# Patient Record
Sex: Male | Born: 1935 | Race: Black or African American | Hispanic: No | Marital: Married | Smoking: Former smoker
Health system: Southern US, Community
[De-identification: ages and names within clinical notes are randomized; demographics above are authoritative.]

## PROBLEM LIST (undated history)

## (undated) DIAGNOSIS — E782 Mixed hyperlipidemia: Secondary | ICD-10-CM

## (undated) DIAGNOSIS — G4733 Obstructive sleep apnea (adult) (pediatric): Secondary | ICD-10-CM

## (undated) DIAGNOSIS — I6529 Occlusion and stenosis of unspecified carotid artery: Secondary | ICD-10-CM

## (undated) DIAGNOSIS — I429 Cardiomyopathy, unspecified: Secondary | ICD-10-CM

## (undated) DIAGNOSIS — I671 Cerebral aneurysm, nonruptured: Secondary | ICD-10-CM

## (undated) DIAGNOSIS — J449 Chronic obstructive pulmonary disease, unspecified: Secondary | ICD-10-CM

## (undated) DIAGNOSIS — I4892 Unspecified atrial flutter: Secondary | ICD-10-CM

## (undated) DIAGNOSIS — Z8611 Personal history of tuberculosis: Secondary | ICD-10-CM

## (undated) DIAGNOSIS — I2510 Atherosclerotic heart disease of native coronary artery without angina pectoris: Secondary | ICD-10-CM

## (undated) DIAGNOSIS — R911 Solitary pulmonary nodule: Secondary | ICD-10-CM

## (undated) DIAGNOSIS — E119 Type 2 diabetes mellitus without complications: Secondary | ICD-10-CM

## (undated) DIAGNOSIS — I10 Essential (primary) hypertension: Secondary | ICD-10-CM

## (undated) HISTORY — DX: Cerebral aneurysm, nonruptured: I67.1

## (undated) HISTORY — DX: Unspecified atrial flutter: I48.92

## (undated) HISTORY — DX: Mixed hyperlipidemia: E78.2

## (undated) HISTORY — DX: Obstructive sleep apnea (adult) (pediatric): G47.33

## (undated) HISTORY — DX: Chronic obstructive pulmonary disease, unspecified: J44.9

## (undated) HISTORY — DX: Cardiomyopathy, unspecified: I42.9

## (undated) HISTORY — DX: Atherosclerotic heart disease of native coronary artery without angina pectoris: I25.10

## (undated) HISTORY — DX: Type 2 diabetes mellitus without complications: E11.9

## (undated) HISTORY — DX: Occlusion and stenosis of unspecified carotid artery: I65.29

## (undated) HISTORY — DX: Solitary pulmonary nodule: R91.1

## (undated) HISTORY — DX: Personal history of tuberculosis: Z86.11

## (undated) HISTORY — DX: Essential (primary) hypertension: I10

---

## 2003-04-08 HISTORY — PX: CORONARY ARTERY BYPASS GRAFT: SHX141

## 2003-06-05 ENCOUNTER — Ambulatory Visit (HOSPITAL_COMMUNITY): Admission: RE | Admit: 2003-06-05 | Discharge: 2003-06-05 | Payer: Self-pay | Admitting: Family Medicine

## 2003-06-09 ENCOUNTER — Ambulatory Visit (HOSPITAL_COMMUNITY): Admission: RE | Admit: 2003-06-09 | Discharge: 2003-06-09 | Payer: Self-pay | Admitting: Family Medicine

## 2003-07-28 ENCOUNTER — Ambulatory Visit (HOSPITAL_COMMUNITY): Admission: RE | Admit: 2003-07-28 | Discharge: 2003-07-28 | Payer: Self-pay | Admitting: Internal Medicine

## 2003-11-12 ENCOUNTER — Emergency Department (HOSPITAL_COMMUNITY): Admission: EM | Admit: 2003-11-12 | Discharge: 2003-11-12 | Payer: Self-pay | Admitting: Emergency Medicine

## 2003-11-14 ENCOUNTER — Ambulatory Visit (HOSPITAL_COMMUNITY): Admission: RE | Admit: 2003-11-14 | Discharge: 2003-11-14 | Payer: Self-pay | Admitting: Family Medicine

## 2003-11-15 ENCOUNTER — Emergency Department (HOSPITAL_COMMUNITY): Admission: EM | Admit: 2003-11-15 | Discharge: 2003-11-16 | Payer: Self-pay | Admitting: Emergency Medicine

## 2003-11-16 ENCOUNTER — Inpatient Hospital Stay (HOSPITAL_COMMUNITY): Admission: AD | Admit: 2003-11-16 | Discharge: 2003-11-22 | Payer: Self-pay | Admitting: Cardiology

## 2003-11-21 ENCOUNTER — Encounter: Payer: Self-pay | Admitting: Cardiology

## 2004-05-27 ENCOUNTER — Ambulatory Visit: Payer: Self-pay | Admitting: *Deleted

## 2004-08-26 ENCOUNTER — Ambulatory Visit: Payer: Self-pay | Admitting: Physician Assistant

## 2004-11-15 ENCOUNTER — Ambulatory Visit: Payer: Self-pay | Admitting: Cardiology

## 2005-01-02 ENCOUNTER — Ambulatory Visit (HOSPITAL_COMMUNITY): Admission: RE | Admit: 2005-01-02 | Discharge: 2005-01-02 | Payer: Self-pay | Admitting: Family Medicine

## 2005-01-27 ENCOUNTER — Ambulatory Visit: Payer: Self-pay | Admitting: *Deleted

## 2005-05-14 ENCOUNTER — Ambulatory Visit: Payer: Self-pay | Admitting: *Deleted

## 2005-07-21 ENCOUNTER — Ambulatory Visit (HOSPITAL_COMMUNITY): Admission: RE | Admit: 2005-07-21 | Discharge: 2005-07-21 | Payer: Self-pay | Admitting: Family Medicine

## 2005-07-28 ENCOUNTER — Ambulatory Visit: Payer: Self-pay | Admitting: *Deleted

## 2006-05-15 ENCOUNTER — Ambulatory Visit: Payer: Self-pay | Admitting: Cardiovascular Disease

## 2006-05-27 ENCOUNTER — Ambulatory Visit: Payer: Self-pay | Admitting: Cardiovascular Disease

## 2006-05-27 ENCOUNTER — Encounter (HOSPITAL_COMMUNITY): Admission: RE | Admit: 2006-05-27 | Discharge: 2006-06-26 | Payer: Self-pay | Admitting: Cardiovascular Disease

## 2007-01-23 ENCOUNTER — Emergency Department (HOSPITAL_COMMUNITY): Admission: EM | Admit: 2007-01-23 | Discharge: 2007-01-23 | Payer: Self-pay | Admitting: Emergency Medicine

## 2007-01-28 ENCOUNTER — Ambulatory Visit (HOSPITAL_COMMUNITY): Admission: RE | Admit: 2007-01-28 | Discharge: 2007-01-28 | Payer: Self-pay | Admitting: Family Medicine

## 2007-02-04 ENCOUNTER — Ambulatory Visit: Payer: Self-pay | Admitting: Cardiology

## 2007-02-05 ENCOUNTER — Ambulatory Visit: Payer: Self-pay | Admitting: Internal Medicine

## 2007-02-05 ENCOUNTER — Ambulatory Visit (HOSPITAL_COMMUNITY): Admission: RE | Admit: 2007-02-05 | Discharge: 2007-02-05 | Payer: Self-pay | Admitting: Cardiology

## 2007-04-05 ENCOUNTER — Ambulatory Visit: Payer: Self-pay | Admitting: Cardiovascular Disease

## 2007-06-07 ENCOUNTER — Encounter (HOSPITAL_COMMUNITY): Admission: RE | Admit: 2007-06-07 | Discharge: 2007-07-07 | Payer: Self-pay | Admitting: Cardiovascular Disease

## 2007-06-14 ENCOUNTER — Ambulatory Visit: Payer: Self-pay | Admitting: Internal Medicine

## 2007-06-21 ENCOUNTER — Ambulatory Visit (HOSPITAL_COMMUNITY): Admission: RE | Admit: 2007-06-21 | Discharge: 2007-06-21 | Payer: Self-pay | Admitting: Cardiology

## 2007-06-25 ENCOUNTER — Ambulatory Visit: Payer: Self-pay | Admitting: Cardiology

## 2007-06-25 ENCOUNTER — Encounter: Payer: Self-pay | Admitting: Cardiology

## 2007-06-25 ENCOUNTER — Inpatient Hospital Stay (HOSPITAL_COMMUNITY): Admission: AD | Admit: 2007-06-25 | Discharge: 2007-07-07 | Payer: Self-pay | Admitting: Cardiology

## 2007-06-25 ENCOUNTER — Inpatient Hospital Stay (HOSPITAL_BASED_OUTPATIENT_CLINIC_OR_DEPARTMENT_OTHER): Admission: RE | Admit: 2007-06-25 | Discharge: 2007-06-25 | Payer: Self-pay | Admitting: Cardiology

## 2007-06-25 ENCOUNTER — Encounter: Payer: Self-pay | Admitting: Cardiothoracic Surgery

## 2007-06-28 ENCOUNTER — Ambulatory Visit: Payer: Self-pay | Admitting: Surgery

## 2007-07-30 ENCOUNTER — Ambulatory Visit: Payer: Self-pay | Admitting: Cardiothoracic Surgery

## 2007-07-30 ENCOUNTER — Encounter: Admission: RE | Admit: 2007-07-30 | Discharge: 2007-07-30 | Payer: Self-pay | Admitting: Cardiothoracic Surgery

## 2007-08-05 ENCOUNTER — Encounter (HOSPITAL_COMMUNITY): Admission: RE | Admit: 2007-08-05 | Discharge: 2007-09-04 | Payer: Self-pay | Admitting: Cardiovascular Disease

## 2007-08-11 ENCOUNTER — Ambulatory Visit: Payer: Self-pay | Admitting: Cardiovascular Disease

## 2008-10-18 IMAGING — CR DG CHEST 1V PORT
1 series · 1 of 1 positions shown · non-contrast
Comparison: [DATE] x 9

CLINICAL DATA: CAD, CABG

PORTABLE CHEST - 1 VIEW

[view not recorded]
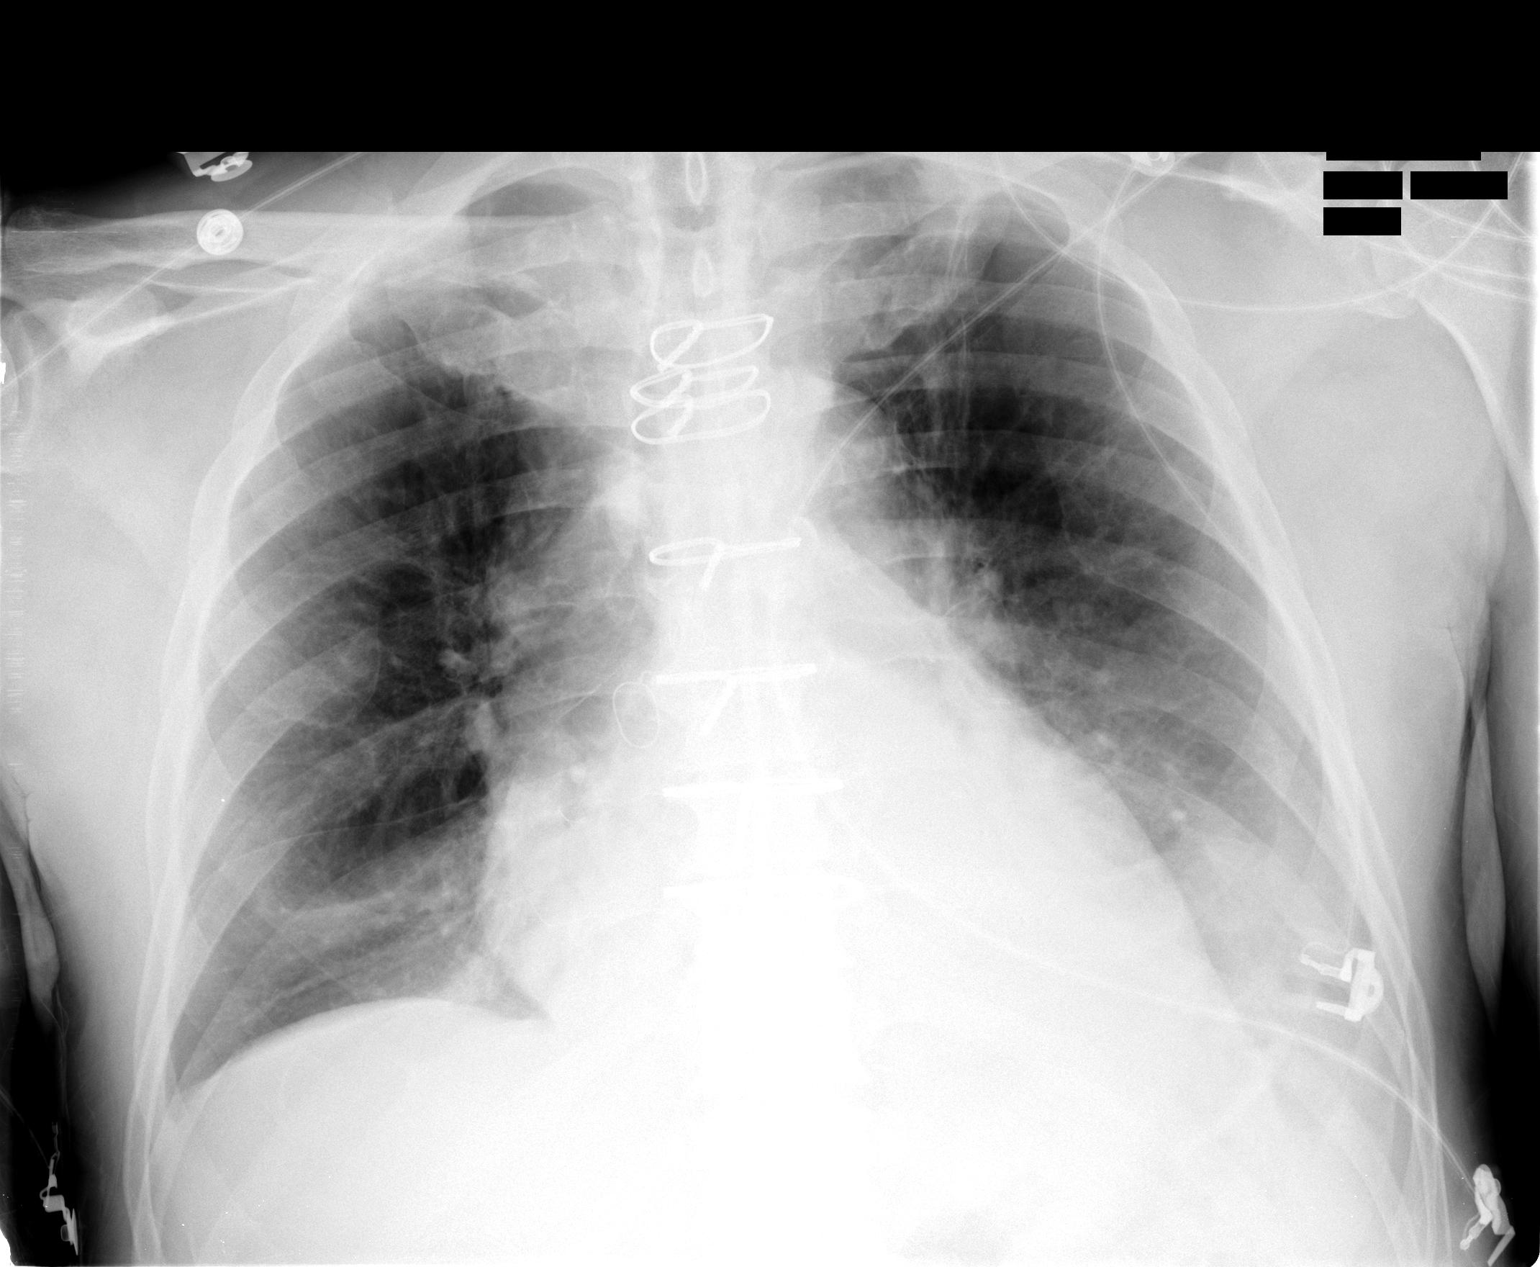

[1 of 1 positions shown; findings below may reference images not displayed]

FINDINGS: Portable exam at 2522 hours shows the patient to have had
a median sternotomy and CABG.  Right IJ sheath has been removed.
Heart is enlarged.  There is pulmonary vascular congestion.
Improved aeration is noted at the right lung base.  Persistent left
lower lobe density is identified.  Left pleural effusion is
suspected.
IMPRESSION: Improved aeration right lung base.

## 2009-04-07 ENCOUNTER — Emergency Department (HOSPITAL_COMMUNITY): Admission: EM | Admit: 2009-04-07 | Discharge: 2009-04-08 | Payer: Self-pay | Admitting: Emergency Medicine

## 2009-07-09 ENCOUNTER — Encounter (INDEPENDENT_AMBULATORY_CARE_PROVIDER_SITE_OTHER): Payer: Self-pay | Admitting: *Deleted

## 2009-07-09 LAB — CONVERTED CEMR LAB
ALT: 18 units/L
Albumin: 4.5 g/dL
Bilirubin, Direct: 0.2 mg/dL
CO2: 20 meq/L
Calcium: 9.7 mg/dL
Chloride: 103 meq/L
Cholesterol: 115 mg/dL
Glucose, Bld: 102 mg/dL
Hgb A1c MFr Bld: 8.4 %
LDL Cholesterol: 53 mg/dL
Sodium: 140 meq/L
Total Protein: 7.1 g/dL

## 2009-07-31 ENCOUNTER — Encounter (INDEPENDENT_AMBULATORY_CARE_PROVIDER_SITE_OTHER): Payer: Self-pay | Admitting: *Deleted

## 2009-07-31 ENCOUNTER — Ambulatory Visit: Payer: Self-pay | Admitting: Cardiology

## 2009-07-31 DIAGNOSIS — I2510 Atherosclerotic heart disease of native coronary artery without angina pectoris: Secondary | ICD-10-CM

## 2009-07-31 DIAGNOSIS — E785 Hyperlipidemia, unspecified: Secondary | ICD-10-CM

## 2009-07-31 DIAGNOSIS — I255 Ischemic cardiomyopathy: Secondary | ICD-10-CM

## 2009-07-31 DIAGNOSIS — E119 Type 2 diabetes mellitus without complications: Secondary | ICD-10-CM

## 2009-07-31 DIAGNOSIS — I6529 Occlusion and stenosis of unspecified carotid artery: Secondary | ICD-10-CM | POA: Insufficient documentation

## 2009-07-31 DIAGNOSIS — I10 Essential (primary) hypertension: Secondary | ICD-10-CM

## 2009-07-31 DIAGNOSIS — R0602 Shortness of breath: Secondary | ICD-10-CM

## 2009-08-01 ENCOUNTER — Encounter: Payer: Self-pay | Admitting: Cardiology

## 2009-08-06 ENCOUNTER — Ambulatory Visit (HOSPITAL_COMMUNITY): Admission: RE | Admit: 2009-08-06 | Discharge: 2009-08-06 | Payer: Self-pay | Admitting: Cardiology

## 2009-08-06 ENCOUNTER — Encounter: Payer: Self-pay | Admitting: Cardiology

## 2009-08-07 ENCOUNTER — Encounter: Payer: Self-pay | Admitting: Cardiology

## 2009-08-07 ENCOUNTER — Ambulatory Visit (HOSPITAL_COMMUNITY): Admission: RE | Admit: 2009-08-07 | Discharge: 2009-08-07 | Payer: Self-pay | Admitting: Cardiology

## 2009-08-07 ENCOUNTER — Ambulatory Visit: Payer: Self-pay | Admitting: Cardiology

## 2009-08-08 ENCOUNTER — Encounter: Payer: Self-pay | Admitting: Cardiology

## 2010-01-14 ENCOUNTER — Ambulatory Visit: Payer: Self-pay | Admitting: Cardiology

## 2010-01-14 ENCOUNTER — Encounter: Payer: Self-pay | Admitting: Adult Health

## 2010-04-28 ENCOUNTER — Encounter: Payer: Self-pay | Admitting: Cardiothoracic Surgery

## 2010-05-07 NOTE — Letter (Signed)
Summary: [place] Results Engineer, agricultural at [facility]  [address] [place] [address]   Phone: [phone]  Fax: [phone]      Aug 08, 2009 MRN: [id number]   HANS RUSHER [address] [place], [place]  [address]   Dear Mr. Racey,  Your test ordered by Selena Batten has been reviewed by your physician (or physician assistant) and was found to be normal or stable. Your physician (or physician assistant) felt no changes were needed at this time.  __X__ Echocardiogram  ____ Cardiac Stress Test  ____ Lab Work  ____ Peripheral vascular study of arms, legs or neck  ____ CT scan or X-ray  ____ Lung or Breathing test  ____ Other: Please continue on current medical treatment.  Thank you.   Nona Dell, MD, F.A.C.C

## 2010-05-07 NOTE — Letter (Signed)
Summary: [place] Results Engineer, agricultural at [facility]  [address] [place] [address]   Phone: [phone]  Fax: [phone]      Aug 07, 2009 MRN: [id number]   Jared Santos [address] [place], [place]  [address]   Dear Mr. Fesperman,  Your test ordered by Selena Batten has been reviewed by your physician (or physician assistant) and was found to be normal or stable. Your physician (or physician assistant) felt no changes were needed at this time.  ____ Echocardiogram  ____ Cardiac Stress Test  ____ Lab Work  __X__ Peripheral vascular study of arms, legs or neck  ____ CT scan or X-ray  ____ Lung or Breathing test  ____ Other: Please continue on current medical treatment.  Thank you.   Nona Dell, MD, F.A.C.C

## 2010-05-07 NOTE — Miscellaneous (Signed)
Summary: Dr.Gerald Hill labs bmp,lipids,liver,a1c4/07/2009  Clinical Lists Changes  Observations: Added new observation of CALCIUM: 9.7 mg/dL (16/01/9603 5:40) Added new observation of ALBUMIN: 4.5 g/dL (98/02/9146 8:29) Added new observation of PROTEIN, TOT: 7.1 g/dL (56/21/3086 5:78) Added new observation of SGPT (ALT): 18 units/L (07/09/2009 8:55) Added new observation of SGOT (AST): 20 units/L (07/09/2009 8:55) Added new observation of ALK PHOS: 74 units/L (07/09/2009 8:55) Added new observation of BILI DIRECT: 0.2 mg/dL (46/96/2952 8:41) Added new observation of CREATININE: 1.25 mg/dL (32/44/0102 7:25) Added new observation of BUN: 25 mg/dL (36/64/4034 7:42) Added new observation of BG RANDOM: 102 mg/dL (59/56/3875 6:43) Added new observation of CO2 PLSM/SER: 20 meq/L (07/09/2009 8:55) Added new observation of CL SERUM: 103 meq/L (07/09/2009 8:55) Added new observation of K SERUM: 4.3 meq/L (07/09/2009 8:55) Added new observation of NA: 140 meq/L (07/09/2009 8:55) Added new observation of LDL: 53 mg/dL (32/95/1884 1:66) Added new observation of HDL: 40 mg/dL (10/05/1599 0:93) Added new observation of TRIGLYC TOT: 111 mg/dL (23/55/7322 0:25) Added new observation of CHOLESTEROL: 115 mg/dL (42/70/6237 6:28) Added new observation of HGBA1C: 8.4 % (07/09/2009 8:55)

## 2010-05-07 NOTE — Assessment & Plan Note (Signed)
Summary: per Dr.Gerald Hill for HX of CAD/tg   Visit Type:  Follow-up Primary Provider:  Dr. Mirna Mires   History of Present Illness: 76 year old male referred for a followup visit. This is my first meeting with him. He was previously followed in this office by Dr. Dietrich Pates, and most recently saw Dr. Eden Emms in 2009. I reviewed his medical history which is detailed below.  Jared Santos denies any significant angina. He has not used any nitroglycerin pills. He reports compliance with medications. Recent lab work ordered by Dr. Loleta Chance back on 5 April revealed sodium 140, potassium 4.3, BUN 25, creatinine 1.2, total cholesterol 115, triglycerides 111, HDL 40, LDL 53, AST 20, ALT 18.  Jared Santos underwent a Myoview back in March of 2009, detailed below, and abnormal suggesting scar versus attenuation affecting a fairly large region of the anterior, anteroseptal, apical, and inferior walls. LVEF was calculated at 31% with diffuse hypokinesis and apical akinesis. I am not certain about the followup arrangements for this study, however the patient has certainly not been seen here since that time.  He reports occasional right lower extremity edema status post vein harvesting. No palpitations, orthopnea, or syncope. No reported claudication. He denies having followup for carotid artery disease at least in the last few years.   Current Medications (verified): 1)  Metformin Hcl 1000 Mg Tabs (Metformin Hcl) .... Take 1 Tab Two Times A Day 2)  Glipizide 10 Mg Tabs (Glipizide) .... Take 1 Tab Two Times A Day 3)  Lisinopril-Hydrochlorothiazide 20-12.5 Mg Tabs (Lisinopril-Hydrochlorothiazide) .... Take 1 Tab Daily 4)  Simvastatin 20 Mg Tabs (Simvastatin) .... Take 1 Tab Daily 5)  Tylenol 325 Mg Tabs (Acetaminophen) .... Take As Needed 6)  Aspir-Low 81 Mg Tbec (Aspirin) .... Take 1 Tab Daily 7)  Nitrostat 0.4 Mg Subl (Nitroglycerin) .Marland Kitchen.. 1 Tablet Under Tongue At Onset of Chest Pain; You May Repeat Every 5 Minutes  For Up To 3 Doses. 8)  Coreg 3.125 Mg Tabs (Carvedilol) .... Take 1 Tablet By Mouth Two Times A Day  Allergies (verified): No Known Drug Allergies  Past History:  Family History: Last updated: Aug 19, 2009 Father: died late 35s of unknown cause Mother: died age 19 with diabetes Siblings: cancer, hypertension, CAD, diabetes  Social History: Last updated: 19-Aug-2009 Retired  Married  Tobacco Use - No  Alcohol Use - no Regular Exercise - no  Past Medical History: Atrial Flutter - s/p RFA 2005 CAD - multivessel, DES RCA 2005, subsequent CABG, LVEF 50% Diabetes Type 2 Hyperlipidemia Hypertension PVD - occluded RICA History of tuberculosis Myocardial Infarction - 2005 Cerebral aneurysm with hemorrhage status post clamping procedure C O P D  Past Surgical History: CABG 2005 - LIMA to LAD, SVG to diagonal, SVG to OM, SVG to RCA and PDA  Family History: Father: died late 88s of unknown cause Mother: died age 44 with diabetes Siblings: cancer, hypertension, CAD, diabetes  Social History: Retired  Married  Tobacco Use - No  Alcohol Use - no Regular Exercise - no  Review of Systems  The patient denies anorexia, fever, weight loss, chest pain, syncope, prolonged cough, headaches, hemoptysis, abdominal pain, melena, hematochezia, and severe indigestion/heartburn.         Otherwise reviewed and negative except as outlined.  Vital Signs:  Patient profile:   76 year old male Height:      71 inches Weight:      196 pounds BMI:     27.44 Pulse rate:   75 / minute BP  sitting:   154 / 72  (right arm)  Vitals Entered By: Dreama Saa, CNA (July 31, 2009 1:42 PM)  Physical Exam  Additional Exam:  Overweight male in no acute distress. HEENT: Conjunctiva and lids normal, oropharynx with moist mucosa. Neck: Supple, very soft left carotid bruit. Lungs: Clear to auscultation, nonlabored. Cardiac: Regular rate and rhythm with frequent ectopy, no S3 is apparent. Abdomen:  Soft, nontender, bowel sounds present. No bruits. Skin: Warm and dry.  Extremities: Trace ankle edema right greater than left, distal pulses 1-2+. Musculoskeletal: No kyphosis. Neuropsychiatric: Alert and oriented x3, affect appropriate.   Nuclear Study  Procedure date:  06/14/2007  Findings:       NUCLEAR DATA:  The patient was studied in a 1 day protocol over a 2   day period though because of snow delay.  He was injected with 30.0   mCi Tc80m labeled Myoview at stress.  10.0 mCi Tc5m labeled Myoview   at rest.  Images are reconstructed in the short, vertical, and   horizontal axes.   On the initial stress images, there was a large defect involving the   anterior wall (mid distal, anteroseptal wall base mid distal) and   apex. There was also decreased tracer counts in the inferior wall   (base mid distal) inferoseptal wall (base mid).   In the recovery images, there was mild improvement in the   anteroseptal wall (base) inferoseptal wall (base mid), but otherwise   no significant change.   Note, in review of the raw data, there is shadowing and activity   under the inferior wall (diaphragm/bowel).  On gating, LVF was   calculated at 31% with diffuse hypokinesis, apical akinesis.   IMPRESSION:   Stress Myoview clinically negative though poor exercise tolerance.   Electrically negative for ischemia.  Myoview with an extensive defect   in the anterior anteroseptal apical inferior inferoseptal regions   with question some small amount of ischemia in the septal wall   (borderline significant).  Overall consistent with scar and possible   soft tissue attenuation.  Cannot exclude hibernation.  Note, in   review from report generated in 2008, this is significantly changed.  EKG  Procedure date:  07/31/2009  Findings:      Sinus rhythm with frequent premature atrial complexes, right bundle branch block, left posterior fascicular block, probable old inferior infarct pattern,  nonspecific T wave changes. Probable anteroseptal infarct pattern.  Impression & Recommendations:  Problem # 1:  CORONARY ATHEROSCLEROSIS, NATIVE VESSEL (ICD-414.01)  Reportedly symptomatically stable without active angina. Patient has a history of prior inferior wall myocardial infarction status post DES to RCA in 2005, with subsequent bypass grafting for development of multivessel disease in 2009. There is discrepancy in assessment of LVEF over time ranging from 50% down to 31% by followup Myoview in 2009. He also has evidence of an anteroseptal infarct at some point. He denies any decline in functional capacity or change in his baseline breathing pattern. Plan to continue medical therapy at this point. Refills provided for sublingual nitroglycerin.  His updated medication list for this problem includes:    Lisinopril-hydrochlorothiazide 20-12.5 Mg Tabs (Lisinopril-hydrochlorothiazide) .Marland Kitchen... Take 1 tab daily    Aspir-low 81 Mg Tbec (Aspirin) .Marland Kitchen... Take 1 tab daily    Nitrostat 0.4 Mg Subl (Nitroglycerin) .Marland Kitchen... 1 tablet under tongue at onset of chest pain; you may repeat every 5 minutes for up to 3 doses.    Coreg 3.125 Mg Tabs (Carvedilol) .Marland Kitchen... Take 1  tablet by mouth two times a day  Problem # 2:  UNSPECIFIED SECONDARY CARDIOMYOPATHY (ICD-425.9)  Possible LV dysfunction based on testing over the last few years. Plan to follow up with a 2-D echocardiogram, and also initiate low-dose Coreg 3.125 mg p.o. b.i.d. This may also help with his blood pressure somewhat.  His updated medication list for this problem includes:    Lisinopril-hydrochlorothiazide 20-12.5 Mg Tabs (Lisinopril-hydrochlorothiazide) .Marland Kitchen... Take 1 tab daily    Aspir-low 81 Mg Tbec (Aspirin) .Marland Kitchen... Take 1 tab daily    Nitrostat 0.4 Mg Subl (Nitroglycerin) .Marland Kitchen... 1 tablet under tongue at onset of chest pain; you may repeat every 5 minutes for up to 3 doses.    Coreg 3.125 Mg Tabs (Carvedilol) .Marland Kitchen... Take 1 tablet by mouth two times a  day  Problem # 3:  ESSENTIAL HYPERTENSION, BENIGN (ICD-401.1)  Blood pressure not optimally controlled today. We discussed this, and he will keep an eye on measurements. Coreg is also being added.  His updated medication list for this problem includes:    Lisinopril-hydrochlorothiazide 20-12.5 Mg Tabs (Lisinopril-hydrochlorothiazide) .Marland Kitchen... Take 1 tab daily    Aspir-low 81 Mg Tbec (Aspirin) .Marland Kitchen... Take 1 tab daily    Coreg 3.125 Mg Tabs (Carvedilol) .Marland Kitchen... Take 1 tablet by mouth two times a day  Problem # 4:  CAROTID ARTERY DISEASE (ICD-433.10)  Reported history of occluded RICA, with residual disease on the left. He has not had a recent followup carotid duplex. This will be arranged.  His updated medication list for this problem includes:    Aspir-low 81 Mg Tbec (Aspirin) .Marland Kitchen... Take 1 tab daily  Other Orders: 2-D Echocardiogram (2D Echo) Carotid Duplex (Carotid Duplex)  Patient Instructions: 1)  Your physician recommends that you schedule a follow-up appointment in: 6 months 2)  Your physician has requested that you have a carotid duplex. This test is an ultrasound of the carotid arteries in your neck. It looks at blood flow through these arteries that supply the brain with blood. Allow one hour for this exam. There are no restrictions or special instructions. 3)  Your physician has requested that you have an echocardiogram.  Echocardiography is a painless test that uses sound waves to create images of your heart. It provides your doctor with information about the size and shape of your heart and how well your heart's chambers and valves are working.  This procedure takes approximately one hour. There are no restrictions for this procedure. 4)  Your physician has recommended you make the following change in your medication: Start taking Carvedilol 3.125mg  by mouth two times a day and Nitroglycerin as needed for chest pain Prescriptions: COREG 3.125 MG TABS (CARVEDILOL) take 1 tablet by  mouth two times a day  #60 x 6   Entered by:   Larita Fife Via LPN   Authorized by:   Loreli Slot, MD, [facility]   Signed by:   Larita Fife Via LPN on 04/54/0981   Method used:   Electronically to        [facility] (retail)       [address]       [place], [place]  [address]       Ph: [phone]       Fax: [phone]   RxID:   [id number] NITROSTAT 0.4 MG SUBL (NITROGLYCERIN) 1 tablet under tongue at onset of chest pain; you may repeat every 5 minutes for up to 3 doses.  #  25 x 3   Entered by:   Larita Fife Via LPN   Authorized by:   Loreli Slot, MD, [facility]   Signed by:   Larita Fife Via LPN on 27/25/3664   Method used:   Electronically to        [facility] (retail)       [address]       [place], [place]  [address]       Ph: [phone]       Fax: [phone]   RxID:   [id number]

## 2010-05-07 NOTE — Assessment & Plan Note (Signed)
Summary: f2y    Visit Type:  Follow-up Primary Provider:  Dr. Mirna Mires   History of Present Illness: Mr. Hitchner is a 4 AAM with known history of hypertension, diabetes, CAD with CABG, PVD who was last seen by Dr. Diona Browner in April of 2011 where he was found to be doing well but his blood pressure was elevated in the 154/70 range. With his history he was placed on Coreg 3.125 two times a day and an echocardiogram was ordered by Dr. Diona Browner.  His last myoview completed in March of 2009 was abnormal suggesting a scar vs attenuation affecting the anterior, anteroseptal, apical, and inferiior walls.  LVEF was 31% at that time.  He is without complaint of symptoms and is tolerating the medication without side effects of dizziness.  He admits to continuing to eat salt in his diet.  Current Medications (verified): 1)  Metformin Hcl 1000 Mg Tabs (Metformin Hcl) .... Take 1 Tab Two Times A Day 2)  Glipizide 10 Mg Tabs (Glipizide) .... Take 1 Tab Two Times A Day 3)  Lisinopril-Hydrochlorothiazide 20-12.5 Mg Tabs (Lisinopril-Hydrochlorothiazide) .... Take 1 Tab Daily 4)  Simvastatin 20 Mg Tabs (Simvastatin) .... Take 1 Tab Daily 5)  Tylenol 325 Mg Tabs (Acetaminophen) .... Take As Needed 6)  Aspir-Low 81 Mg Tbec (Aspirin) .... Take 1 Tab Daily 7)  Nitrostat 0.4 Mg Subl (Nitroglycerin) .Marland Kitchen.. 1 Tablet Under Tongue At Onset of Chest Pain; You May Repeat Every 5 Minutes For Up To 3 Doses. 8)  Coreg 3.125 Mg Tabs (Carvedilol) .... Take 1 Tablet By Mouth Two Times A Day  Allergies: No Known Drug Allergies  Comments:  Nurse/Medical Assistant: patient brought med  bottles and reviewed previous ov with patient and he uses [facility] in [place] ,gerald hill is primary m.d   Past History:  Past medical, surgical, family and social histories (including risk factors) reviewed, and no changes noted (except as noted below).  Past Medical History: Reviewed history from 07/31/2009 and no changes  required. Atrial Flutter - s/p RFA 2005 CAD - multivessel, DES RCA 2005, subsequent CABG, LVEF 50% Diabetes Type 2 Hyperlipidemia Hypertension PVD - occluded RICA History of tuberculosis Myocardial Infarction - 2005 Cerebral aneurysm with hemorrhage status post clamping procedure C O P D  Past Surgical History: Reviewed history from 07/31/2009 and no changes required. CABG 2005 - LIMA to LAD, SVG to diagonal, SVG to OM, SVG to RCA and PDA  Family History: Reviewed history from 07/31/2009 and no changes required. Father: died late 56s of unknown cause Mother: died age 65 with diabetes Siblings: cancer, hypertension, CAD, diabetes  Social History: Reviewed history from 07/31/2009 and no changes required. Retired  Married  Tobacco Use - No  Alcohol Use - no Regular Exercise - no  Review of Systems       All other systems have been reviewed and are negative unless stated above.   Vital Signs:  Patient profile:   76 year old male Weight:      197 pounds BMI:     27.58 Pulse rate:   61 / minute BP sitting:   157 / 87  (right arm)  Vitals Entered By: Dreama Saa, CNA (January 14, 2010 11:11 AM)  Physical Exam  General:  Well developed, well nourished, in no acute distress. Lungs:  Clear bilaterally to auscultation and percussion. Heart:  Non-displaced PMI, chest non-tender; regular rate and rhythm, S1, S2 without murmurs, rubs or gallops.. Normal abdominal aortic size, no bruits. Femorals normal pulses,  no bruits. Pedals normal pulses. No edema, no varicosities. Abdomen:  Bowel sounds positive; abdomen soft and non-tender without masses, organomegaly, or hernias noted. No hepatosplenomegaly. Msk:  Back normal, normal gait. Muscle strength and tone normal. Pulses:  pulses normal in all 4 extremities Extremities:  No clubbing or cyanosis. Neurologic:  Alert and oriented x 3. Psych:  Normal affect.   EKG  Procedure date:  01/14/2010  Findings:      Normal sinus  rhythm with rate of:72 bpm  PAC's noted.  PVC's noted.  Right bundle branch block.    Impression & Recommendations:  Problem # 1:  UNSPECIFIED SECONDARY CARDIOMYOPATHY (ICD-425.9) Reivew of echocardiogram ordered by Dr. Diona Browner dated 08/07/2009 demonstrated: Wall thickness increased in a pattern of mild to moderate LVH.  Systolic fx was normal.  EF 55%.  Mild AoV regurgiation,LA mildly dialated, PAP .  This is a significant improvement from myoview study completed in 2009.  Keeping BP controlled wil be essential His updated medication list for this problem includes:    Lisinopril-hydrochlorothiazide 20-12.5 Mg Tabs (Lisinopril-hydrochlorothiazide) .Marland Kitchen... Take 1 tab daily    Aspir-low 81 Mg Tbec (Aspirin) .Marland Kitchen... Take 1 tab daily    Nitrostat 0.4 Mg Subl (Nitroglycerin) .Marland Kitchen... 1 tablet under tongue at onset of chest pain; you may repeat every 5 minutes for up to 3 doses.    Coreg 3.125 Mg Tabs (Carvedilol) .Marland Kitchen... Take 1 tablet by mouth two times a day  Problem # 2:  ESSENTIAL HYPERTENSION, BENIGN (ICD-401.1) I have rechecked the blood pressures manually in each arm for verification.  Right arm [id number]; Left arm 138/ 56.    I will not change his medications at that time as the coreg appears to have improved his blood pressure.  I asked Larita Fife Via, LPN to provide him with written instructions on the DASH diet.  He asked me about walking.  I have encouraged him to do so.  He states he has a treadmill at home. I have asked him to walk on it a minimum of 30 minutes each day.  He verbalizes understanding.  He will follow-up in 6 months unless symptomatic. His updated medication list for this problem includes:    Lisinopril-hydrochlorothiazide 20-12.5 Mg Tabs (Lisinopril-hydrochlorothiazide) .Marland Kitchen... Take 1 tab daily    Aspir-low 81 Mg Tbec (Aspirin) .Marland Kitchen... Take 1 tab daily    Coreg 3.125 Mg Tabs (Carvedilol) .Marland Kitchen... Take 1 tablet by mouth two times a day  Patient Instructions: 1)  Your physician recommends  that you schedule a follow-up appointment in: 6 months 2)  Your physician recommends that you continue on your current medications as directed. Please refer to the Current Medication list given to you today. 3)  You have been given a copy of the Dash diet to help you in your efforts to lower your salt intake.

## 2010-05-07 NOTE — Letter (Signed)
[facility]: [facility]   Imported By: Faythe Ghee 08/01/2009 10:28:23  _____________________________________________________________________  External Attachment:    Type:   Image     Comment:   External Document

## 2010-05-07 NOTE — Letter (Signed)
Summary: [facility]   Imported By: Faythe Ghee 08/01/2009 10:27:55  _____________________________________________________________________  External Attachment:    Type:   Image     Comment:   External Document

## 2010-06-23 LAB — GLUCOSE, CAPILLARY

## 2010-06-23 LAB — POCT I-STAT, CHEM 8
BUN: 32 mg/dL — ABNORMAL HIGH (ref 6–23)
Chloride: 105 mEq/L (ref 96–112)
Creatinine, Ser: 1.3 mg/dL (ref 0.4–1.5)
Glucose, Bld: 297 mg/dL — ABNORMAL HIGH (ref 70–99)
Hemoglobin: 18.4 g/dL — ABNORMAL HIGH (ref 13.0–17.0)
Potassium: 4.7 mEq/L (ref 3.5–5.1)
Sodium: 137 mEq/L (ref 135–145)

## 2010-06-23 LAB — DIFFERENTIAL
Eosinophils Absolute: 0 10*3/uL (ref 0.0–0.7)
Lymphocytes Relative: 11 % — ABNORMAL LOW (ref 12–46)
Lymphs Abs: 0.8 10*3/uL (ref 0.7–4.0)
Monocytes Relative: 5 % (ref 3–12)
Neutro Abs: 6 10*3/uL (ref 1.7–7.7)
Neutrophils Relative %: 83 % — ABNORMAL HIGH (ref 43–77)

## 2010-06-23 LAB — CBC
Platelets: 129 10*3/uL — ABNORMAL LOW (ref 150–400)
RBC: 5.58 MIL/uL (ref 4.22–5.81)
WBC: 7.2 10*3/uL (ref 4.0–10.5)

## 2010-08-20 NOTE — Assessment & Plan Note (Signed)
OFFICE VISIT   Jared Santos, Jared Santos  DOB:  07/05/1933                                        July 30, 2007  CHART #:  [id number]   CURRENT PROBLEMS:  1. Status post CABG x5  on 06/28/2007, by Dr. Rexanne Mano for severe      three-vessel coronary artery disease.  2. History of atrial flutter.  3. Hypertension.  4. Diabetes.  5. COPD with postoperative low oxygen saturation requiring home oxygen      therapy.   PRESENT ILLNESS:  Jared Santos returns for his first office visit after  multivessel coronary artery bypass grafting for unstable angina and  severe three-vessel disease.  He is a 76 year old black male with a  history of diabetes and prior percutaneous interventions of his known  coronary disease.  He presented with progressing angina and severe  recurrence of three-vessel disease and underwent multivessel bypass  surgery by Dr. Laneta Simmers including left IMA to the LAD and vein graft to  the OM, distal right coronary and posterior descending and diagonal.  He  had postoperative atrial fibrillation which converted to sinus rhythm on  amiodarone.  He had low oxygen saturations and could not be completely  weaned from oxygen and was sent home on home oxygen 2 liters.   CURRENT MEDICATIONS:  1. Aspirin 81 mg.  2. Glipizide 10 mg b.i.d.  3. Metformin 500 mg b.i.d.  4. Zocor 20 mg daily.  5. Amiodarone 200 mg b.i.d.  6. Combivent inhaler b.i.d.  7. Oxycodone p.r.n. pain.   PHYSICAL EXAMINATION:  Blood pressure 160/70, pulse 70, respirations 18,  saturation 95%.  He is alert and pleasant.  His oxygen is removed, and  the saturation is checked in 10 minutes, and it is still 97%.  He  ambulates well without tachycardia or tachypnea.  His breath sounds are  clear, and the sternum is stable, well-healed.  Cardiac rhythm is  regular without gallop or murmur, and his legs are without edema, and  the saphenous vein incision is well healed.   The PA and lateral  chest x-ray reveals clear lung fields with stable  cardiac silhouette and sternal wires are intact.   PLAN:  We will continue his home oxygen.  He was told that he could  resume driving and light daily activities, but to avoid lifting more  than 20 pounds until three months.  He will reduce  his amiodarone to  once daily until the prescription runs out and then stop the medication.  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  He will return here as needed.   Kerin Perna, M.D.  Electronically Signed   PV/MEDQ  D:  07/30/2007  T:  07/30/2007  Job:  [id number]   cc:   Jared Morton. Riley Kill, MD, [facility]

## 2010-08-20 NOTE — Procedures (Signed)
NAME:  Jared Santos, Jared Santos NO.:  [id number]   MEDICAL RECORD NO.:  [id number]          PATIENT TYPE:  OUT   LOCATION:  RAD                           FACILITY:  [facility]   PHYSICIAN:  Pricilla Riffle, MD, FACCDATE OF BIRTH:  07/05/1933   DATE OF PROCEDURE:  02/05/2007  DATE OF DISCHARGE:                                ECHOCARDIOGRAM   REFERRING PHYSICIANS:  Dr.  Loleta Chance.   TEST INDICATIONS:  The patient is a 76 year old with known coronary  artery disease status post MI with stents in 2002.   A 2-D echo with echo Doppler.  Left ventricle is normal in size with an  end-diastolic dimension of 42 mm.  The interventricular septum is mildly  thickened at 14 mm.  Posterior wall is grossly normal.   Left atrium is upper normal at 40 mm.  Right atrium and right ventricle  are normal.  Aortic root is normal at 35 mm.   The aortic valve is mildly thickened, calcified.  There is no stenosis.  There is mild insufficiency 1/4.   Mitral valve is mildly thickened.  There is no stenosis.  There is trace  insufficiency.   Pulmonic valve is normal with no insufficiency.  Tricuspid valve is  normal with trace insufficiency.   Overall LV systolic function is mildly depressed at approximately 45-50%  with hypokinesis of the distal septal, distal inferior, distal posterior  and apical walls.  Note there is some mild diastolic dysfunction.   RV EF is normal.   IVC is normal.  No pericardial effusion is seen.      Pricilla Riffle, MD, [facility]  Electronically Signed     PVR/MEDQ  D:  02/05/2007  T:  02/06/2007  Job:  [id number]   cc:   Annia Friendly. Loleta Chance, MD  Fax: [phone]

## 2010-08-20 NOTE — Letter (Signed)
February 04, 2007    Annia Friendly. Loleta Chance, MD  [address], [address]  [place], [place] [address]   RE:  EASHAN, SCHIPANI  MRN:  [id number]  /  DOB:  07/05/1933   Dear Earvin Hansen,   It was my pleasure re-evaluating Mr. Barmore in the office today, at your  request.  As you know, he is generally a patient of Dr. Eden Emms, but we  elected to see him quickly for chest discomfort.  He developed a URI  approximately a month ago and had prolonged coughing.  He noted  discomfort in the lateral aspects of his lower abdomen.  He subsequently  developed left chest discomfort that was unpredictable in its  occurrence.  There was no relationship to exercise.  He did not have  true dyspnea.  There was no definite chest wall tenderness.  Discomfort  radiated up into the axilla.  Since you evaluated him, the severity of  his symptoms has decreased.  A CT scan of the chest was unrevealing,  except for underlying COPD and a pleural-based nodule.   CURRENT MEDICATIONS INCLUDE:  1. Glipizide XL 20 mg b.i.d.  2. Aspirin 81 mg daily.  3. Ramipril 10 mg daily.  4. Clopidogrel 75 mg daily.  5. Metformin 500 mg b.i.d.  6. Simvastatin 20 mg daily.   Lipid profile in January of this year was excellent.   Mr. Mckinney also describes burning epigastric pain that has been  attributed to acid in the past.  He wonders what he should be taking  for this.  He also requests a note to return to work.  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   On exam, a pleasant gentleman, in no acute distress.  The weight is 198,  seven pounds less than in February.  Blood pressure 115/60, heart rate  85 and regular, respirations 16.  NECK:  No jugular venous distention, no carotid bruits.  LUNGS:  Clear.  CARDIAC:  Normal first and second heart sounds.  Fourth heart sound  present.  ABDOMEN:  Soft and nontender.  No organomegaly.  No abdominal wall nor  chest wall tenderness.  EXTREMITIES:  Normal distal  pulses, no edema.   EKG:  Normal sinus rhythm with frequent PACs; LVH by voltage criteria.  Prior inferior myocardial infarction.  Anterior T-wave inversion.  When  compared to a prior tracing, July 28, 2005, all of these findings are  new.   A stress nuclear study was performed earlier this year.  This revealed  inferior infarction with mild to moderate depression in left ventricular  systolic function and no ischemia.  A carotid ultrasound study, also  earlier this year, revealed total occlusion of the right internal  carotid.   IMPRESSION:  Mr. Claire symptoms are atypical and not likely to  reflect myocardial ischemia.  New T-wave inversion anteriorly is of some  concern.  We will proceed with an echocardiogram to determine if there  is a new anterior wall motion abnormality.  His EKG tracings from his  stress test in February will be reviewed.  In the absence of symptoms, I  am not inclined to proceed to catheterization, based upon EKG  abnormalities.  Mr. Hanawalt will call for additional symptoms.  Otherwise, he will see Dr. Eden Emms again as planned next February.    Sincerely,      Gerrit Friends. Dietrich Pates, MD, [facility]  Electronically Signed    RMR/MedQ  DD: 02/04/2007  DT: 02/05/2007  Job #: [id number]

## 2010-08-20 NOTE — Consult Note (Signed)
NAME:  Jared Santos, Jared Santos NO.:  [id number]   MEDICAL RECORD NO.:  [id number]           PATIENT TYPE:   LOCATION:                                 FACILITY:   PHYSICIAN:  Evelene Croon, M.D.     DATE OF BIRTH:  07/05/1933   DATE OF CONSULTATION:  06/25/2007  DATE OF DISCHARGE:                                 CONSULTATION   Cardiovascular Surgical Consultation   REFERRING PHYSICIAN:  Arturo Morton. Riley Kill, MD   REASON FOR CONSULTATION:  Severe 3-vessel coronary artery disease and 2+  mitral regurgitation with unstable angina.   CLINICAL HISTORY:  I was asked by Dr. Riley Kill to evaluate Mr. Dowell for  consideration of coronary artery bypass graft surgery, and possible  mitral valve repair.  He is a 76 year old black male with history of  diabetes, hypertension, and hyperlipidemia, as well as known coronary  artery disease status post non-ST-segment elevation MI in August 2005.  He underwent catheterization on November 15, 1993, and underwent stenting  of a 99% mid right coronary stenosis with a Taxus drug-eluting stent.  This resulted in jailing of the acute marginal, and some subsequent  marked ST-segment elevation in lead V1 and V2.  Repeat angiography of  the left system was stable.  The patient also developed atrial flutter  in the setting of myocardial infarction, and had to undergo  radiofrequency ablation on November 21, 2003.   The patient has done relatively well over the years, and has been  followed by Dr. Eden Emms.  Over the past 4 months or so he has been having  exertional substernal chest discomfort and shortness of breath.  These  symptoms have worsened over time, and now occur with mild exertion  around his house or walking.  He underwent a stress Myoview scan on  March 9 which showed an ejection fraction of 31% with diffuse  hypokinesis, and apical akinesis.  Perfusion images revealed an  extensive defect in the anterior, anteroseptal, apical, inferior,  and  inferoseptal regions with question of a small amount of ischemia on the  septal wall.  Overall the findings were felt to be consistent with scar,  and possible soft tissue attenuation.  This was a significant change  from this prior scan; and, therefore, he underwent cardiac  catheterization.   Catheterization, today, showed severe 3-vessel coronary artery disease.  The LAD had about 70% proximal stenosis.  It was then occluded just  after the first diagonal branch which was a large vessel.  The diagonal  branch had about 80% ostial stenosis.  The mid-and-distal LAD filled  slowly by collaterals and appeared to be relatively a large vessel.  The left circumflex had a single large marginal branch and had 80%  proximal stenosis.  The right coronary artery was a dominant vessel that  had 80% proximal stenosis.  There was less than 20% InStent narrowing in  the midportion.  There was 75% distal stenosis before the takeoff of the  posterior descending branch.  Posterior descending still had about 80%  midvessel stenosis.   The left ventricular ejection  fraction was about 45% with apical  hypokinesis-to-akinesis and a question of clot at the apex.  There is  possible 2+ mitral regurgitation although, I think, this was last.  Pulmonary artery pressures were within normal limits at 31/16 with a  wedge of 16.  There was no gradient across the aortic valve.  LVEDP was  19.  Right ventricular pressure was 33/6 with a right atrial pressure of  3.  His pulmonary artery O2 saturation was 61% with an aortic root  saturation of 90%.   REVIEW OF SYSTEMS AS FOLLOWS:  GENERAL:  He denies any fever or chills.  He has had no recent weight changes.  He denies fatigue.  EYES:  Negative.  ENT:  Negative.  ENDOCRINE:  He has adult onset diabetes.  He  denies hypothyroidism.  CARDIOVASCULAR:  As above.  He has had  exertional substernal chest discomfort, and exertional dyspnea.  He also  reports some  orthopnea and PND that wake him up in the morning.  He has  had some palpitations.  He denies peripheral edema.  RESPIRATORY:  He  has had some cough at times, as well as some intermittent hemoptysis.  GI:  He denies nausea or vomiting.  He denies melena and bright red  blood per rectum.  GU:  He has had no dysuria or hematuria.  NEUROLOGICAL:  He denies any focal weakness or numbness.  He denies  dizziness and syncope.  He has never had a TIA or a stroke.  He does  report having a cerebral aneurysm clipped about 35 years ago, and also  has a history of an occluded right internal carotid artery.  VASCULAR:  He denies claudication and phlebitis.  He has never had a DVT.  MUSCULOSKELETAL:  He denies arthralgias and myalgias.  ALLERGIES:  None.  PSYCHIATRIC:  Negative.  HEMATOLOGICAL:  He has been on Plavix  since his drug-eluting stent was inserted.  He denies any unusual  bleeding.   PAST MEDICAL HISTORY SIGNIFICANT FOR:  1. Coronary disease as documented above.  2. He has a history of atrial flutter as mentioned above.  3. He has history of hypertension, hyperlipidemia, and diabetes.  4. He has history of an occluded right internal carotid artery.  5. He has history of cerebral aneurysm clipping 35 years ago.  6. He has remote history of tuberculosis.   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  He lives with his wife in [place].  He has 4 adult  children.  He had a 40 pack-year smoking history, but quit in 1988.  He  denies alcohol use.  He stays active, working in his yard and garden.   FAMILY HISTORY:  His father died in his late 34s of unknown cause.  Mother died at 26 with diabetes.  He has 4 brothers, 2 of them are  living.  He had 3 sisters who are all deceased.  There was a history of  hypertension, coronary disease, and diabetes in his sisters.   PHYSICAL EXAMINATION:  VITAL SIGNS:  His blood pressure is 149/71.  His  pulse is  80 and regular.  Respiratory rate is 18 unlabored.  Height is 5  feet 11 inches, and his weight is 205 pounds.  He is a well-developed  black male in no distress.  HEENT:  Exam shows him to be normocephalic and atraumatic.  Pupils are  equal and reactive to light and accommodation.  Extraocular muscles are  intact.  His throat is clear.  NECK:  Exam shows normal carotid pulses bilaterally.  There are no  bruits.  There is no adenopathy or thyromegaly.  CARDIAC:  Exam shows a regular rate and rhythm with normal S1 and S2.  There is no murmur, rub, or gallop.  LUNGS:  Clear.  ABDOMINAL EXAM:  Shows active bowel sounds.  His abdomen is soft, flat,  and nontender.  There are no palpable masses or organomegaly.  EXTREMITIES:  Exam shows no peripheral edema.  Pedal pulses are palpable  bilaterally.  SKIN:  Warm and dry.  NEUROLOGIC:  Exam shows him to be alert and oriented x3.  Motor and  sensory exam is grossly normal.   LABORATORY:  Examination prior to catheterization showed a hemoglobin of  16.8, a platelet count pf 143,000, and white blood cell count of 5.8.  His coagulation profile was normal.  Electrolytes were normal with a BUN  of 12 and a creatinine of 1.23.  Chest x-ray on March 16 showed stable  chronic lung disease with no acute abnormality.   IMPRESSION:  Mr. Losasso has severe 3-vessel coronary disease with  probable mild mitral regurgitation and moderate left ventricular  dysfunction.  He has daily symptoms suggesting ischemia, and a markedly  abnormal stress test.  I agree that coronary bypass graft surgery is the  best treatment to prevent further ischemia and infarction.  I discussed  the operative procedure with he and his wife, son, and daughter.  We  discussed alternatives, benefits, and risks; including but not limited  to bleeding, blood transfusion, infection, stroke, myocardial  infarction, graft failure, and death.  He understands and would like to  proceed with  surgery.  We will tentatively plan to do this on Monday,  June 28, 2007.  He will have preoperative carotid Dopplers to reassess  his carotid disease.      Evelene Croon, M.D.  Electronically Signed     BB/MEDQ  D:  06/25/2007  T:  06/25/2007  Job:  [id number]

## 2010-08-20 NOTE — Assessment & Plan Note (Signed)
[facility]                       [place] CARDIOLOGY OFFICE NOTE   MATTSON, DAYAL                       MRN:          [id number]  DATE:04/05/2007                            DOB:          07/05/1933    [name] is seen today in followup.  I last saw him in February 2008.  He subsequently saw Dr. Dietrich Pates in October.   He is a diabetic with known coronary artery disease.  He has had  moderate LAD disease and a previous stent to the right coronary artery.   His stent was back in 2005.  He had a nonischemic Myoview in March 2008  with a previous inferior wall infarct.   His EF has been in the 45% range.   He has not had any recurrent chest pain.  Apparently Dr. Loleta Chance was  concerned about his heart.  The patient occasionally gets hypoglycemic  reactions and may have some palpitations or elevated heart rates at this  time.  He does have a history of previous atrial flutter status post  ablation by Dr. Ladona Ridgel.  There has been no other history of arrhythmias  in regards to chronic A fib or VT.  In talking to the patient he feels  like he is doing well.  He is not having significant chest pain.  Has  been no PND, orthopnea.  He is actually been trying to walk some.   The patient's review of systems otherwise negative.  His past medical  history is remarkable for coronary disease, diabetes, hypertension,  hyperlipidemia.  He has a history of a total right internal carotid  artery stenosis with no critical disease in the left.  This needs  followup.  He has history of previous smoking, quitting in 1985.   CURRENT MEDICATIONS:  1. Glipizide 20 b.i.d.  2. Aspirin a day.  3. Altace 10 a day.  4. Plavix 75 a day.  5. Metformin 500 b.i.d.  6. Simvastatin 20.  7. Lisinopril HCTZ 20/12.5.   EXAM:  Remarkable for a healthy-appearing middle-aged black male in no  distress.  Weight is 200, blood pressure is 122/60, pulse is 80 and regular,  afebrile, respiratory rate 14.  HEENT:  Unremarkable.  Carotids have no obvious bruits.  No  lymphadenopathy, thyromegaly, JVP elevation.  LUNGS:  Clear diaphragmatic motion.  No wheezing.  S1-S2 normal heart sounds.  PMI normal.  ABDOMEN:  Benign bowel sounds positive.  No tenderness.  No  hepatosplenomegaly.  No hepatojugular reflux.  No bruits.  Distal pulses are intact.  No edema.  NEURO:  Nonfocal.  SKIN:  Warm and dry.  No muscular weakness.   His EKG shows sinus rhythm previous inferior wall infarction and  biphasic T-waves across recording which are old.   IMPRESSION:  1. History coronary disease.  Nonischemic Myoview in March.  No chest      pain.  Continue medical therapy.  Add Coreg 3.125 b.i.d.  The      patient is a diabetic with known coronary disease and should be on      a beta blocker.  2. Diabetes.  Follow up with Dr. Loleta Chance.  Try to avoid hypoglycemic      reactions.  Glipizide 20 b.i.d. appears to be somewhat of a high      dose.  Hemoglobin A1c quarterly.  3. Hypertension, currently under good control.  Continue Altace 10 mg      a day.  4. Hypercholesterolemia in the setting of coronary disease.  Continue      simvastatin 20 a day.  Lipid and liver profile in 6 months.  5. History of carotid disease with total right internal carotid artery      occlusion.  Followup duplex in March to follow left-sided disease.   Overall I think Jared Santos is stable.  Will add a beta blocker given question  of elevated heart rate during hypoglycemic reactions and given his known  coronary artery disease.  If he has any recurrence of his palpitations,  he may need an event monitor, particularly in light of his history of  atrial flutter.   However, currently I think he is stable.     Noralyn Pick. Eden Emms, MD, [facility]  Electronically Signed    PCN/MedQ  DD: 04/05/2007  DT: 04/05/2007  Job #: [id number]   cc:   Annia Friendly. Loleta Chance, MD

## 2010-08-20 NOTE — Assessment & Plan Note (Signed)
[facility]                       [place] CARDIOLOGY OFFICE NOTE   RUBIN, DAIS                      MRN:          [id number]  DATE:08/11/2007                            DOB:          07/05/1933    Mr. Usman is a 76 year old patient previously seen by Dr. Dietrich Pates in  October and December.  He arranged for the patient to have a heart cath,  subsequently showed significant 3-vessel disease.  The patient had  previously been seen by Dr. Ladona Ridgel and Dr. Samule Ohm he has a history of an  MI with stent to the RCA in 2005.  He subsequently was found has severe  3-vessel disease.   The patient's perioperative course was apparently complicated by  hypoxemia.  He was sent home on oxygen for while.  He is a previous  smoker, but quit 20 years ago.  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   The patient was on amiodarone he had postop atrial fibrillation quite a  few years ago.  He has had a previous history of flutter ablation by Dr.  Ladona Ridgel.   Looking back through the records it appears that the patient also has a  moderate decrease in LV function with an EF in the 30-40% range.   In talking to the patient he has been doing well.  He has had two  classes of cardiac rehab at [facility]  Dr. Donata Clay saw the patient  and thought he was healing well.  He was to return to work on May 15th.  However, he works at [employer] and the restrictions on the note  indicated not lifting more than 20 pounds as part of his work.  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  I rewrote the note for  the patient to return to work without restrictions on June 15th.  He  will continue with his cardiac rehab.  His breathing has improved.  He  is not coughing.  There is no wheezing.  There is no sputum production.  He is not having chest pain.  He does still get a bit of pain in his  chest which sounds musculoskeletal.   PAST MEDICAL HISTORY:  Remarkable for  atrial flutter with postop atrial  fibrillation, hypertension, diabetes, COPD, hypercholesterolemia.   MEDICATIONS:  The patient had previously been on lisinopril HCTZ.  This  was stopped postoperatively due to relative low blood pressure, but his  blood pressure has been rising again.  Given the fact that he is a  diabetic, I think, that he should be back on it.  The patient current  meds include glipizide 20 b.i.d., an aspirin a day, metformin 500  b.i.d., simvastatin 20 a day, lisinopril/HCTZ 20/12.5.   PHYSICAL EXAMINATION:  Remarkable for an elderly black male in no  distress.  Weight is 192 done 8 pounds from preop.  Blood pressure 140/66, pulse 74  and regular, afebrile, respiratory rate 14.  HEENT:  Unremarkable.  Carotids are without bruit.  No lymphadenopathy, no thyromegaly, no JVP  elevation.  LUNGS:  Clear to diaphragmatic motion.  No wheezing.  S1-S2 with normal heart sounds.  PMI normal.  Sternotomy was well-  healed.  ABDOMEN:  Benign.  Bowel sounds positive.  No AAA, no tenderness, no  hepatosplenomegaly, hepatojugular reflexes.  Pulses intact.  No edema.  NEURO:  Nonfocal.  SKIN:  Warm and dry.  No muscular weakness.   IMPRESSION:  1. The patient is doing well post CABG for coronary artery disease.      Continue aspirin therapy.  Consider addition of beta-blocker in the      future given his PAF.  2. History of atrial flutter with PAF.  Continue aspirin.  Amiodarone      stopped in the perioperative period.  It may have contributed to      hypoxemia.  The patient will let us know if he has any recurrent      palpitations.  3. Rise in blood pressure previously on lisinopril/HCTZ will resume      cardiorenal protective effects from diabetes.  4. Diabetes mellitus follow with Dr. Loleta Chance.  Continue metformin 500      b.i.d., hemoglobin A1c quarterly.  5. Hyperlipidemia in the setting of coronary bypass surgery.  Continue      simvastatin lipid and liver profile in  6 months.  6. The patient apparently also has had some remote vascular surgery in      the neck. We will have to review his hospital records particularly      to see what his pre-CABG Dopplers show.  There is a question of a      collateralized right carotid.   He does not have any significant disease on the left side.  Again,  unfortunately it took me quite a while to read through the patient's  chart and his old records as I was not involved with his care for preop  cath.   I will have the patient followup with Dr. Dietrich Pates in 6 months; and,  again, at that time he may need to have his carotids looked into again.     Noralyn Pick. Eden Emms, MD, [facility]  Electronically Signed    PCN/MedQ  DD: 08/11/2007  DT: 08/11/2007  Job #: [id number]

## 2010-08-20 NOTE — Cardiovascular Report (Signed)
NAME:  Jared Santos, Jared Santos NO.:  [id number]   MEDICAL RECORD NO.:  [id number]          PATIENT TYPE:  OIB   LOCATION:  1961                         FACILITY:  [facility]   PHYSICIAN:  Arturo Morton. Riley Kill, MD, FACCDATE OF BIRTH:  07/05/1933   DATE OF PROCEDURE:  06/25/2007  DATE OF DISCHARGE:                            CARDIAC CATHETERIZATION   INDICATIONS:  Mr. Spillers is a 76 year old gentleman who presents with  shortness of breath.  This has been progressive.  He has a high risk  myocardial perfusion imaging study.  He has had prior stenting of the  right coronary artery in 2005.  Risks, benefits and alternatives were  discussed with the patient prior to the procedure.   PROCEDURE:  1. Right and left heart catheterization.  2. Selective coronary arteriography.  3. Selective left ventriculography.   DESCRIPTION OF PROCEDURE:  The patient was brought to the  catheterization laboratory, prepped and draped in usual fashion. Through  an anterior puncture the right femoral artery was easily entered.  A 4-  French sheath was placed.  Views  of the left and right coronary  arteries were obtained in multiple angiographic projections.  Central  aortic and left ventricular pressures measured with pigtail.  Ventriculography was then performed in the RAO projection.  He tolerated  the procedure well.   The findings included severe three-vessel disease, LV dysfunction, and  evidence of significant mitral regurgitation.  As such, we elected to  pass a Swan-Ganz catheter and measured right heart pressures.  Sequential pressures were obtained from the right atrium, right  ventricle, pulmonary artery and pulmonary capillary wedge.  Saturations  were obtained in the aorta and pulmonary artery.  All catheters were  then subsequently removed and hemostasis achieved by direct manual  compression.   He tolerated the procedure without complication.   HEMODYNAMIC DATA.:  1. Right atrial  pressure 3.  2. RV 33/6.  3. Pulmonary artery 31/16, mean 22.  4. Pulmonary capillary wedge 16.  5. LV 122/19.  6. Aortic 110/62.  7. There was no significant gradient on pullback across the aortic      valve.   ANGIOGRAPHIC DATA.:  1. Ventriculography in the RAO projection reveals modest reduction in      left ventricular function, there appeared to be an apical wall      motion abnormality with an estimated ejection fraction of 45%.      There also appeared to be the possibility of thrombus at the apex.      Some mitral regurgitation was noted although it was hard to grade      but it will be graded as at least a 2+.  2. Left main is free of critical disease.  3. The LAD is occluded after the takeoff of the second major diagonal.      There is a tiny first diagonal with 70% ostial narrowing but it is      small.  Leading into the second diagonal is about an 80% stenosis      and it is a large graftable vessel.  There is  a ghost vessel which      represents the third diagonal and LAD.  4. The circumflex demonstrates about an 80% stenosis.  The distal      vessel is a large-caliber vessel.  5. The right coronary artery demonstrates an 80% stenosis in the first      bend.  The stent demonstrates less than 20% narrowing at the      midvessel.  There is a 75% segmental plaque in the distal vessel.      The PDA has an 80% stenosis.  There is also a large posterolateral      segment.   CONCLUSION:  1. Moderate reduction in LV function with an apical wall motion      abnormality and probable apical clot.  2. Mitral regurgitation.  3. Severe three-vessel progressive coronary artery disease.   DISPOSITION:  The patient will be admitted.  He likely will need  revascularization surgery and possible mitral valve repair.  I will  obtain a surgical consult.      Arturo Morton. Riley Kill, MD, [facility]  Electronically Signed     TDS/MEDQ  D:  06/25/2007  T:  06/25/2007  Job:  [id number]   cc:    Arturo Morton. Riley Kill, MD, [facility]  Noralyn Pick. Eden Emms, MD, [facility]  Pricilla Riffle, MD, [facility]

## 2010-08-20 NOTE — H&P (Signed)
NAME:  Jared Santos, Jared Santos NO.:  [id number]   MEDICAL RECORD NO.:  [id number]          PATIENT TYPE:  OIB   LOCATION:  1961                         FACILITY:  [facility]   PHYSICIAN:  Arturo Morton. Riley Kill, MD, FACCDATE OF BIRTH:  07/05/1933   DATE OF ADMISSION:  06/25/2007  DATE OF DISCHARGE:                              HISTORY & PHYSICAL   PRIMARY CARDIOLOGIST:  Dr. Charlton Haws.   PRIMARY CARE Jillian Warth:  Dr. Evlyn Courier.   PATIENT PROFILE:  A 76 year old African male with prior history of CAD  who presents for catheterization today secondary to recurrent angina and  dyspnea.   PROBLEM:  1. CAD.      a.     August 2005 non-ST-elevation MI.      b.     November 15, 1993 cardiac catheterization EF 70% without       regional wall motion abnormalities, left main 40-50% ostial; LAD       60% mid, 40% distal; left circumflex 40% proximal; RCA large and       dominant vessel with a 99% stenosis after the second acute       marginal.  This area was stented with 23.5 x 20 mm Taxus drug-       eluting stent resulting in jailing of the acute marginal and       subsequent marked ST-segment elevation in lead V1 and V2.  Repeat       angiography of the left system was stable.  2. History of atrial flutter in the setting of MI status post      radiofrequency ablation November 21, 2003.  3. Hypertension.  4. Hyperlipidemia.  5. Diabetes mellitus  6. PVD with history of occluded right internal carotid artery.  7. History of cerebral aneurysm was hemorrhagic status post clamping      about 35 years ago.  8. Remote history of tuberculosis.  9. Remote tobacco abuse quitting in 1988.   HISTORY OF PRESENT ILLNESS:  This is a 76 year old African American male  with prior history of CAD status post non-ST-elevation MI in the setting  of right coronary artery with drug-eluting stent 2005 and subsequent ST-  segment elevation secondary to jailed acute marginal.  This has been  followed by  Dr. Eden Emms over the years and has done reasonably well.  However, over the past 4 months he has been resting exertional  substernal chest discomfort associated with dyspnea.  Dyspnea now occurs  after walking only about 100 feet.  He recently saw Dr. Eden Emms March 2  and subsequently underwent an exercise Myoview on March 9 where he  exercised for 4 minutes, achieving a maximum heart rate of 130 beats per  minute and 5.8 mets.  His EF was 31% with diffuse hypokinesis and apical  akinesis.  He had no ECG changes.  Perfusion imaging revealed an  extensive defect in the anterior and anteroseptal apical inferior  inferoseptal regions with question of a small amount of ischemia in the  septal wall.  Overall findings were consistent with scar and possible  soft  tissue attenuation.  This was a significant change from prior  Myoview and as a result he was sent for catheterization today.  The  patient reports he continues to have symptoms on nearly a daily basis,  especially dyspnea.   ALLERGIES:  NO KNOWN DRUG ALLERGIES.   HOME MEDICATIONS:  1. Glipizide XL 20 mg b.i.d.  2. Aspirin 81 mg daily.  3. Altace 10 mg daily.  4. Plavix 75 mg daily.  5. Metformin 500 mg b.i.d.  6. Simvastatin 20 mg daily.  7. Lisinopril HCTZ 20/12.5 mg half a tablet daily.   FAMILY HISTORY:  Father died in his late 24s of unknown cause.  Mother  died at age 82 with history of diabetes.  He had four brothers,  currently two are living.  There is a history of cancer in his brothers.  He had three sisters, all deceased.  History hypertension, coronary  disease and diabetes in sisters.   SOCIAL HISTORY:  He lives in [place] with his wife.  He has four  grown children and eight grandchildren.  He is a retired [employer] architect  and currently works part-time at [employer] two days a week.  He had  about a 40 pack-year history of tobacco abuse, quitting in 1988.  Denies  any alcohol or drug use.  Is not currently  exercising.   REVIEW OF SYSTEMS:  Positive for chest pain and dyspnea as outlined in  the HPI.  He also occasionally has night sweats or chills as well as  palpitations.  He has intermittent constipation as well as right  shoulder pain.  Otherwise, all systems reviewed and negative.   PHYSICAL EXAMINATION:  GENERAL:  Pleasant African male in no acute  distress.  Awake, alert and oriented x3.  HEENT:  Normal.  Nares grossly intact, nonfocal.  SKIN:  Warm and dry without lesions or masses.  NECK:  No JVD or bruits.  LUNGS:  Respirations regular and unlabored.  CARDIAC:  Regular S1, S2.  No S3, S4, murmurs.  ABDOMEN: Soft, nontender.  Bowel sounds present x4.  EXTREMITIES:  Warm, dry, no clubbing, cyanosis or edema.  Dorsalis  pedis, posterior tibial pulses 2+ and equal bilaterally.  VITAL SIGNS: Afebrile, heart rate 30, respirations 20, blood pressure  149/71.   SENSORY CLINICAL FINDINGS:  He had some lab work from March 16:  Hemoglobin 16.2, hematocrit 48.0, WBC 5.8, platelets 143, INR 1.1.  Sodium 136, potassium 3.6, chloride 103, CO2 27, BUN 12, creatinine  1.23, glucose 210, calcium 9.0.   ASSESSMENT:  1. Unstable angina.  The patient has history of chest discomfort and      dyspnea along with an abnormal Myoview.  He will be sent to cardiac      catheterization today.  Will hold his metformin and he remains on      aspirin, Plavix and statin therapy.  It is not clear to me why he      is on both Altace as well as lisinopril.  I question if this is      accurate.  Patient is not sure this morning.  2. Hypertension.  Slightly elevated this morning.  As above, the      patient is on Altace 10 a day as well as lisinopril HCTZ 20/12.5.      Will need to make some adjustments with this.  Will likely benefit      from Norvasc instead of      one of the two ACE inhibitors  that he is on.  3. Hyperlipidemia.  Continue simvastatin therapy.  4. Diabetes.  Hold metformin.  Continue the  other agents and sliding      scale insulin if he is admitted.      Nicolasa Ducking, ANP      Arturo Morton. Riley Kill, MD, [facility]  Electronically Signed    CB/MEDQ  D:  06/25/2007  T:  06/25/2007  Job:  [id number]

## 2010-08-20 NOTE — Op Note (Signed)
NAME:  Jared Santos, Jared Santos NO.:  [id number]   MEDICAL RECORD NO.:  [id number]          PATIENT TYPE:  INP   LOCATION:  2315                         FACILITY:  [facility]   PHYSICIAN:  Evelene Croon, M.D.     DATE OF BIRTH:  07/05/1933   DATE OF PROCEDURE:  06/28/2007  DATE OF DISCHARGE:                               OPERATIVE REPORT   PREOPERATIVE DIAGNOSIS:  Severe three-vessel coronary disease.   POSTOPERATIVE DIAGNOSIS:  Severe three-vessel coronary disease.   OPERATIVE PROCEDURES:  1. Median sternotomy.  2. Extracorporeal circulation.  3. Coronary bypass graft surgery x5 using a left internal mammary      artery graft to the left anterior descending coronary, with a      saphenous vein graft to the diagonal branch of the left anterior      descending artery, a saphenous vein graft to the obtuse marginal      branch of the left circumflex coronary artery, and a sequential      saphenous vein graft to the distal right coronary artery and      posterior descending coronary artery.  4. Endoscopic vein harvesting from the right leg.   ATTENDING SURGEON:  Evelene Croon, M.D.   ASSISTANT:  Doree Fudge, PA-C   ANESTHESIA:  General endotracheal.   CLINICAL HISTORY:  This patient is a 76 year old black male with a  history of diabetes and known coronary artery disease status post non-ST-  segment elevation MI in August 2005, followed by stenting of the right  coronary artery with a drug-eluting stent.  He has done well until the  last few months, when he has had recurrent substernal chest pain and  dyspnea on insertion.  He underwent a stress Myoview exam on June 14, 2007, showing extensive defect in the anterior, anteroseptal, apical,  inferior, and inferoseptal regions with a question of small amount of  ischemia.  This was a significant change from his prior study.  Cardiac  catheterization showed severe three-vessel coronary disease with an  ejection fraction  of about 45% and a question of 2+ moderate mitral  regurgitation with normal right and left heart pressures.  The patient  had a large diagonal branch that had about 80% stenosis.  The LAD was  occluded after this diagonal branch with filling of the distal LAD by  collaterals from the left.  The left circumflex had a single large  marginal vessel that had an 80% proximal stenosis.  The right coronary  artery was diffusely diseased and had 80% proximal and 75% distal  stenosis before the takeoff of the posterior descending branch.  The  area that was stented in the midportion had less than 20% stenosis.  The  posterior descending branch had about 80% midvessel stenosis.  There  were two other smaller posterolateral branches.  After review of the  angiogram and examination of the patient, it was felt that coronary  artery bypass graft surgery was the best treatment to prevent further  ischemia and infarction.  I discussed the operative procedure with the  patient and his family including  alternatives, benefits, and risks  including but not limited to bleeding, blood transfusion, infection,  stroke, myocardial infarction, graft failure, and death.  They  understood and agreed to proceed.   OPERATIVE PROCEDURE:  The patient was taken to the operating room and  placed on the table in supine position.  After induction of general  endotracheal anesthesia, a Foley catheter was placed in the bladder  using sterile technique.  Then the chest, abdomen and both lower  extremities were prepped and draped in the usual sterile manner.  The  chest was entered through a median sternotomy incision and the  pericardium opened in the midline.  Examination of the heart showed good  ventricular contractility.  The ascending aorta had no palpable plaques  in it.   A transesophageal echocardiogram was performed by anesthesiology.  This  showed good left ventricular function.  There was trivial central mitral   regurgitation and trivial central aortic insufficiency.   Then the left internal mammary artery was harvested from the chest wall  as a pedicle graft.  This was a medium-caliber vessel with excellent  blood flow through it.  At the same time a segment of greater saphenous  vein was harvested from the right leg using endoscopic vein harvest  technique.  This vein was of large caliber but good quality.  It had  several localized areas of varicosity coming off the main portion of the  vein.  It was a somewhat difficult vein harvest due to the size of the  vein and the varicosities coming off of the side branches.  It was taken  out endoscopically but it was necessary to make several small counter  incisions in the lower leg to divide these varicosities.   Then the patient was heparinized and when an adequate activated clotting  time was achieved, the distal ascending aorta was cannulated using a 20-  [place] aortic cannula for arterial inflow.  Venous outflow was achieved  using a two-stage venous cannula through the right atrial appendage.  An  antegrade cardioplegia and vent cannula was inserted in the aortic root.  A retrograde cardioplegia cannula was inserted through the right atrium  into the coronary sinus.   The patient placed on cardiopulmonary bypass and the distal coronaries  identified.  The LAD was diffusely diseased.  There was an area in the  distal portion that was suitable for grafting.  There was a moderate-  sized proximal diagonal branch that was graftable.  There was a single  marginal vessel that was visible proximally, where it was heavily  diseased.  It then became intramyocardial and stayed intramyocardial  throughout its entire extent.  I dissected this vessel down into the  myocardium and in the midportion the vessel was fairly free of disease  and suitable for grafting.  The right coronary artery was also diffusely  diseased.  Just before the takeoff of the  posterior descending branch  there was minimal disease and it was suitable for grafting in this  region to supply the distal posterolateral branches.  The posterior  descending itself was diseased in its proximal and midportions but  distally was free of disease and suitable for grafting.   Then the aorta was crossclamped and 500 mL of cold blood antegrade  cardioplegia was administered in the aortic root with quick arrest the  heart.  This was followed by 500 mL of cold blood retrograde  cardioplegia.  Additional doses of retrograde cardioplegia were given at  about 20-minute  intervals to maintain a myocardial temperature around 10  degrees centigrade.  Systemic hypothermia to 28 degrees centigrade and  topical hypothermia with iced saline was used.  A temperature probe was  placed in the septum and an insulating pad in the pericardium.   The first distal anastomosis was performed to the distal right coronary  artery.  The internal diameter was about 2.5 mm.  The conduit used was a  segment of greater saphenous vein and the anastomosis performed in a  sequential side-to-side manner using a continuous 7-0 Prolene suture.  Flow was noted through the graft and was excellent.   The second distal anastomosis was performed to the distal portion of the  posterior descending coronary artery.  The internal diameter of this  vessel was about 1.6 mm.  The conduit used was the same segment of  greater saphenous vein and the anastomosis performed in a sequential end-  to-side manner using continuous 7-0 Prolene suture.  Flow was noted  through the graft and was excellent.   Then the third distal anastomosis was performed to the obtuse marginal  branch.  The internal diameter of this vessel was about 2.5 mm.  The  conduit used was a second segment of greater saphenous vein and the  anastomosis performed in an end-to-side manner using continuous 7-0  Prolene suture.  Flow was noted through the  graft and was excellent.   The fourth distal anastomosis was performed to the diagonal branch.  The  internal diameter was 1.75 mm.  The conduit used was a third segment of  greater saphenous vein and the anastomosis performed in an end-to-side  manner using continuous 7-0 Prolene suture.  Flow was noted through the  graft and was excellent.   The fifth distal anastomosis was performed to the distal LAD.  The  internal diameter of this vessel was about 1.75 mm.  The conduit used  was the left internal mammary graft and this was brought through an  opening in the left pericardium anterior to the phrenic nerve.  It was  anastomosed to the LAD in an end-to-side manner using continuous 8-0  Prolene suture.  The pedicle was sutured to the epicardium with 6-0  Prolene sutures.  The patient was then rewarmed to 37 degrees  centigrade.  With the clamp in place the three proximal vein graft  anastomoses were performed to the aortic root in an end-to-side manner  using continuous 6-0 Prolene suture.  The clamp was then removed from  the mammary pedicle.  There was rapid warming of the ventricular septum  and return of spontaneous ventricular fibrillation.  The crossclamp was  removed with a time of 99 minutes and the patient was defibrillated into  sinus rhythm.   The proximal and distal anastomoses appeared hemostatic and the lie of  the grafts satisfactory.  Graft markers were placed around the proximal  anastomoses.  Two temporary right ventricular and right atrial pacing  wires were placed and brought through the skin.   When the patient had rewarmed to 37 degrees centigrade, he was weaned  from cardiopulmonary bypass on low-dose dopamine.  Total bypass time was  128 minutes.  Cardiac function appeared excellent with a cardiac output  of 5 L/min.  A transesophageal echocardiogram was then performed and  there was trivial central mitral regurgitation and good left ventricular  function.   Protamine was then given and the venous and aortic cannulas  were removed without difficulty.  Hemostasis was achieved.  Three chest  tubes were placed with a tube in the posterior pericardium, one in the  left pleural space and one in the anterior mediastinum.  The sternum was  then closed with double #6 stainless steel wires.  The fascia was closed  with continuous #1 Vicryl suture.  The subcutaneous tissue was closed  with continuous 2-0 Vicryl and the skin with a 3-0 Vicryl subcuticular  closure.  The lower extremity vein harvest site was closed in layers in  a similar manner.  The sponge, needle and instrument counts were correct  according to the scrub nurse.  Dry sterile dressings were applied over  the incisions and around the chest tubes, which were hooked to Pleur-  Evac suction.  The patient remained hemodynamically stable and was  transported to the SICU in guarded but stable condition.      Evelene Croon, M.D.  Electronically Signed     BB/MEDQ  D:  06/28/2007  T:  06/29/2007  Job:  [id number]   cc:   [facility]  [facility]

## 2010-08-20 NOTE — Op Note (Signed)
NAME:  Jared Santos, Jared Santos NO.:  [id number]   MEDICAL RECORD NO.:  [id number]          PATIENT TYPE:  INP   LOCATION:  2315                         FACILITY:  [facility]   PHYSICIAN:  Burna Forts, M.D.DATE OF BIRTH:  07/05/1933   DATE OF PROCEDURE:  06/28/2007  DATE OF DISCHARGE:                               OPERATIVE REPORT   INTRAOPERATIVE TRANSESOPHAGEAL ECHOCARDIOGRAPHIC REPORT   INDICATIONS FOR PROCEDURE:  Jared Santos is a 76 year old gentleman who  presents today for coronary artery bypass grafting and for possible  mitral valve repair or replacement.  We have been asked to place a TEE  probe for evaluation of cardiac structures and functions with particular  attention to the mitral valve, as this patient was found to have some  mitral regurgitant flow on his previous examination.   He is taken to the OR for routine induction of general anesthesia, after  which the TEE probe is protected, lubricated, and passed oropharyngeally  into the stomach and slightly withdrawn for imaging cardiac structures.   LEFT VENTRICLE:  The left ventricular chamber is seen initially on a  short axis view.  The overall impression is that of a depressed left  ventricular chamber function with an EF estimated in the range of 30-  35%.  There was mild thickening in the left ventricular chamber  concentrically.  There is significant hypokinesis noted in the anterior  anteroseptal wall distally out to the apex.  Both short and long axis  views are obtained.  There is a small intraventricular band noted in the  left ventricular chamber.  Papillary muscles are seen and well outlined.   MITRAL VALVE:  This is a mildly thickened mitral valve apparatus seen in  a four chamber view.  The leaflets appeared to be open satisfactorily  during diastolic inflow and close and coapt appropriately during  cephalic contraction.  Multiple views are obtained.  On colored Doppler  examination of the  mitral valve, there is essentially trace mitral  regurgitant flow noted.  In no view were we able to obtain more than  trace to 1+ regurgitant flow, which would be considered even less than  mild.   LEFT ATRIAL CHAMBER:  The left atrial chamber is normal in its size and  shape.  The left atrial appendage is visualized, and no masses are noted  within.  The inner atrial septum is interrogated and is intact.   AORTIC VALVE:  Mild sclerosis noted in this normally appearing three  cusp aortic valve.   Ventricles are essentially normal.   TRICUSPID VALVE:  The tricuspid valve is seen.  All leaflets are  visualized.  They are thin, compliant, mobile.  There is 1+ tricuspid  regurgitant flow noted.   Right atrium normal.  Right atrium is appreciated.   Patient is placed on cardiopulmonary bypass.  Hypothermia was begun.  Coronary artery bypass grafting is carried out.  There was no plan at  this time for surgery or other on this mitral valve.  Patient was then  rewarmed and separated from cardiopulmonary bypass with the initial  attempts.  POST CARDIOPULMONARY TEE EXAMINATION:  Limited exam.   LEFT VENTRICLE:  The left ventricular chamber reveals slightly increased  inotropic activity in a short axis view in this early bypass.  Inotropes  have begun.  There did appear to remain the hypokinetic area in that  anterior and anteroseptal wall out to the apex, but the rest of the  cardiac chamber was thickening, moving inward and contracting in a  fashion that was deemed slightly improved from the pre-bypass period.  The rest of the cardiac examination was as previously described.  There  was essentially no increase in the mitral regurgitant flow in this post  bypass period, and the patient is returned to the cardiac intensive care  unit in stable condition.           ______________________________  Burna Forts, M.D.     JTM/MEDQ  D:  06/28/2007  T:  06/28/2007  Job:  [id number]

## 2010-08-20 NOTE — Discharge Summary (Signed)
NAME:  Jared Santos, Jared Santos NO.:  [id number]   MEDICAL RECORD NO.:  [id number]          PATIENT TYPE:  INP   LOCATION:  2016                         FACILITY:  [facility]   PHYSICIAN:  Evelene Croon, M.D.     DATE OF BIRTH:  07/05/1933   DATE OF ADMISSION:  06/25/2007  DATE OF DISCHARGE:  07/07/2007                               DISCHARGE SUMMARY   FINAL DIAGNOSIS:  Severe 3-vessel coronary artery disease.   IN-HOSPITAL DIAGNOSES:  1. Postoperative atrial fibrillation.  2. Volume overload postoperatively.  3. Acute blood loss anemia postoperatively.  4. Two plus mitral regurgitation.   SECONDARY DIAGNOSES:  1. History of coronary artery disease status post non-ST-segment      elevation myocardial infarction in August 2005, status post      stenting of the 99% mid right coronary stenosis with a TAXUS drug-      eluting stent.  2. History of atrial flutter.  3. Hypertension.  4. Hyperlipidemia.  5. Diabetes mellitus.  6. Occluded right internal carotid artery.  7. History of cerebral aneurysm clipping 35 years ago.  8. History of tuberculosis.  9. Left 40-60% internal carotid artery stenosis.   IN-HOSPITAL OPERATIONS AND PROCEDURES:  1. Cardiac catheterization.  2. Intraoperative transesophageal echocardiogram.  3. Coronary artery bypass grafting x5 using a left internal mammary      artery graft to left anterior descending coronary, saphenous vein      graft to diagonal branch of left anterior descending artery,      saphenous vein graft to obtuse marginal branch of left circumflex      coronary artery, sequential saphenous vein graft to distal right      coronary artery and posterior descending coronary artery.      Endoscopic vein harvesting from right leg.   HISTORY AND PHYSICAL AND HOSPITAL COURSE:  The patient is a 76 year old  African American male with history of diabetes and known coronary artery  disease status post non-ST-segment elevation myocardial  infarction in  August 2005, followed by stenting of the right coronary artery with a  drug-eluting stent.  He has done well until the last few months.  The  patient had developed recurrent substernal chest pain and dyspnea on  exertion.  He underwent stress Myoview exam, June 14, 2007, showing  extensive defect in the anterior, anteroseptal, apical, and inferior and  inferoseptal regions with a question of small amount of ischemia.  This  was a significant change from his prior study.  Cardiac catheterization  was ordered and done showing severe 3-vessel coronary artery disease.  Following catheterization, Dr. Laneta Simmers was consulted.  Dr. Laneta Simmers  discussed with the patient undergoing coronary artery bypass grafting.  He discussed the risks and benefits with the patient.  The patient  acknowledges understanding and agreed to proceed.  Surgery was scheduled  for June 28, 2007.  The patient had been on Plavix preoperatively, and  this was stopped.  The patient underwent preoperative bilateral carotid  duplex ultrasound showing on the right it to be occluded and on the left  had 40% to 60%  ICA stenosis.  The patient remained stable  preoperatively.  For details of the patient's past medical history and  physical exam, please see dictated H&P.   The patient was taken to the operating room on June 28, 2007, where he  underwent coronary artery bypass grafting x5 using a left internal  mammary artery graft to left anterior descending coronary artery,  saphenous vein graft to diagonal branch of left anterior descending  artery, saphenous vein graft to obtuse marginal branch of left  circumflex coronary artery, sequential saphenous vein graft to distal  right coronary artery and posterior descending coronary artery.  Endoscopic vein harvesting from the right leg was done.  The patient  tolerated this procedure well and was transferred to the intensive care  unit in stable condition.   Postoperatively, the patient was noted to be  hemodynamically stable.  He was able to be extubated postoperatively.  Post extubation, the patient noted to be alert and oriented x4.  Neuro  intact.  The patient was initially able to be placed on nasal cannula  with sats greater than 90% on 6 L of oxygen.  On 6 L, the patient on  postop day 1, O2 sats desaturated to the 80s.  He was switched to face  mask.  Chest x-ray done on postop day 1 showed left basilar atelectasis.  He had minimum drainage from chest tubes.  Chest tubes were able to be  discontinued on postop day 1 in normal fashion.  The patient is noted to  have a history of remote tobacco abuse and symptomatic changes on his  lungs noted per Dr. Laneta Simmers in the OR.  He was continued on  bronchodilators and encouraged to use his incentive spirometer.  The  patient's pulmonary status was followed closely.  Repeat chest x-ray in  the a.m. showed continued bibasilar atelectasis.  He remained on 50%  face mask.  This was weaned, and he was able to be switched over to  nasal cannula at 4 L.  The patient continued to use his incentive  spirometer.  He was eventually started on Combivent inhaler.  Oxygen  weaned as tolerated.  The patient was noted to tolerate 2 L of oxygen at  rest, but required 4 L with ambulation due to desaturating.  Due to the  patient's poor pulmonary function and unable to be weaned off oxygen,  plan to arrange for home O2.  Postoperatively, the patient was noted to  be in normal sinus rhythm.  All drips were able to be weaned and  discontinued.  Heart rate and blood pressure remained stable.  Early  morning of postop day 2, the patient noted to go into rapid atrial  fibrillation with heart rate of 120s to 140s.  He was started on IV  amiodarone.  Unfortunately, the patient remained in rapid atrial  fibrillation and IV amiodarone was continued.  Due to the patient's  persistent atrial fibrillation, he was started on  Coumadin p.o. on  postop day 3.  By postop day 4, the patient converted back to normal  sinus rhythm on amiodarone.  He was switched to p.o. form, and Coumadin  was stopped due to conversion to sinus rhythm.  Heart rate was continued  to be monitored.  The patient was maintained on p.o. amiodarone.  Remainder of his postoperative course, he remained in normal sinus  rhythm.  Postoperatively, the patient did have some volume overload.  He  was started on diuretics.  Daily weights monitored, and  the patient was  back near his baseline weight prior to discharge to home.  The patient  also had mild acute blood loss anemia postoperatively.  He was  asymptomatic.  Hemoglobin and hematocrit were followed closely.  The  patient did not require any transfusions postoperatively.  The blood  count remained stable.  The patient's blood sugars were followed  closely.  He does have a history of diabetes.  Initially, we restarted  on Lantus insulin.  Creatinine was followed and remained stable.  The  patient's blood sugars continued to be followed, and he was restarted on  his metformin and glipizide at a lower dose with discontinuation of  Lantus insulin prior to discharge to home.  Blood sugars were stable  prior to discharge.  Postoperatively, cardiac rehab was working well  with the patient.  He was out of bed ambulating well with assistance.  He was tolerating diet well.  No nausea or vomiting noted.  The patient  did have a longer intensive care unit stay secondary to his pulmonary  function and atrial fibrillation.  He was eventually able to be  transferred out of the ICU to the __________ on postop day 6.   Postop day 8, the patient's vital signs noted to be stable.  He was  afebrile.  He was saturating 93% on 2 liters at rest.  Blood pressure,  heart rate stable.  He was in normal sinus rhythm.  All incisions clean,  dry, and intact and healing well.   LABORATORY DATA:  Last labs done postop  day 7 showed a white count 6.8,  hemoglobin 10.7, hematocrit 30.9, platelet count 199.  Sodium of 132,  potassium of 4.9, chloride of 100, bicarb of 26, BUN of 22, creatinine  of 1.34, glucose of 170.  The patient is tentatively ready for discharge  home in the a.m. postop day 9, July 07, 2007, with home health nurse and  arrangements made for home O2.   FOLLOWUP APPOINTMENTS:  Followup appointment has been arranged with Dr.  Donata Clay, to see for Dr. Laneta Simmers on July 30, 2007, at 11:45 a.m.  The  patient will need to obtain PA and lateral chest x-ray 30 minutes prior  to this appointment.  He will need to follow up with Dr. Riley Kill in 2  weeks.  He will need to contact Dr. Rosalyn Charters office to make these  arrangements.  The staple removal appointment has been arranged with the  nurse for July 13, 2007, at 10:00 a.m.   ACTIVITY:  The patient instructed no driving until released to do so, no  lifting over 10 pounds.  The patient is told to ambulate 3-4 times per  day, progress as tolerated, and continue his breathing exercises.   INCISIONAL CARE:  The patient is told to shower, washing his incisions  using soap and water.  He is to contact the office if he develops any  drainage or opening from any of his incision sites.   DIET:  The patient educated on diet to be low-fat, low-salt, as well as  carbohydrate-modified medium calorie diet.   DISCHARGE MEDICATIONS:  1. Aspirin 81 mg daily.  2. Glipizide 10 mg b.i.d.  3. Metformin 500 mg b.i.d.  4. Zocor 20 mg daily.  5. Amiodarone 200 mg b.i.d.  6. Combivent inhaler 2 puffs q.i.d.  7. Oxycodone 5 mg 1-2 tabs q.4-6 h. p.r.n.      Theda Belfast, [place]      Evelene Croon, M.D.  Electronically Signed    KMD/MEDQ  D:  07/06/2007  T:  07/07/2007  Job:  [id number]   cc:   Arturo Morton. Riley Kill, MD, [facility]

## 2010-08-23 NOTE — Op Note (Signed)
NAME:  Jared Santos, Jared Santos NO.:  [id number]   MEDICAL RECORD NO.:  [id number]                   PATIENT TYPE:  INP   LOCATION:  3715                                 FACILITY:  [facility]   PHYSICIAN:  Doylene Canning. Ladona Ridgel, M.D.               DATE OF BIRTH:  07/05/1933   DATE OF PROCEDURE:  11/21/2003  DATE OF DISCHARGE:                                 OPERATIVE REPORT   REQUESTING PHYSICIAN:  Salvadore Farber, M.D.   INDICATIONS FOR CONSULTATION:  Evaluation of atrial flutter.   HISTORY OF PRESENT ILLNESS:  The patient is a 76 year old man who was  admitted to the hospital with acute coronary syndrome and underwent  angioplasty.  He spontaneously developed atrial flutter with a rapid  ventricular response and is now referred for evaluation.  The patient  desires not to take like-long Coumadin.  He has a remote history of cerebral  aneurysm.  He denies any history of syncope or palpitations.  His past  medical history is notable for a history of cerebral aneurysm in the past.  He has a history of diabetes.  He has a history of tuberculosis, treated.   SOCIAL HISTORY:  The patient lives in [place] and is married.  He quit  smoking cigarettes approximately 20 years ago.  He denies alcohol use.   FAMILY HISTORY:  Notable for mother dying age 72 of diabetes, father dying  age 24 of unknown causes.   REVIEW OF SYSTEMS:  Negative for fevers, chills and night sweats, no vision  or hearing problems, denies any skin rashes or lesions.  Denies peripheral  edema, palpitations, claudication, cough or wheezing.  He does note chest  pain and shortness of breath.  He denies dysuria, hematuria, nocturia,  denies weakness, numbness or anxiety.  He denies arthralgias or arthritis.  He denies nausea, vomiting, diarrhea or constipation, polyuria or  polydipsia.  The rest of his review of systems was negative.   PHYSICAL EXAMINATION:  GENERAL:  The patient is a pleasant,  well-appearing  75 year old man in no distress.  VITAL SIGNS:  Blood pressure was 150/80.  The pulse was 88 and irregularly.  Respiratory rate was 14.  HEENT:  Exam was normocephalic and atraumatic.  Pupils were equal and round.  Oropharynx was moist.  Sclerae were anicteric.  NECK:  The neck revealed no  jugular venous distention.  There was no thyromegaly.  The trachea was  midline.  CARDIOVASCULAR:  Irregular rhythm with normal S1 and S2.  I did not  appreciate any murmurs.  LUNGS:  Clear bilaterally to auscultation.  EXTREMITIES:  Demonstrated no cyanosis, clubbing or edema.  There was  minimal amount of ecchymoses from a prior arterial puncture site in the  right femoral artery.  ABDOMEN:  Soft, nontender, nondistended.  There was no organomegaly.  NEUROLOGIC:  Exam was nonfocal.   LABORATORY DATA:  His EKG demonstrated typical  atrial flutter with variable  AV conduction.   IMPRESSION:  1. Atrial flutter with a rapid ventricular response.  2. Chest pain with an acute coronary syndrome, status post angioplasty.  3. Diabetes.  4. Hypertension.   DISCUSSION:  I have discussed treatment options with Mr. Hoback.  The risks,  benefits, goals and expectations of electrophysiologic study and catheter  ablation have been discussed with the patient and he wishes to proceed.  This will be scheduled later today.                                               Doylene Canning. Ladona Ridgel, M.D.    GWT/MEDQ  D:  11/21/2003  T:  11/21/2003  Job:  [id number]   cc:   Kirk Ruths, M.D.  [address]  [place]  [place] [address]  Fax: [phone]   Salvadore Farber, M.D. [facility]  [address]  [address]  [place]  [place] [address]

## 2010-08-23 NOTE — Procedures (Signed)
NAME:  Jared Santos, Jared Santos NO.:  [id number]   MEDICAL RECORD NO.:  [id number]                   PATIENT TYPE:  EMS   LOCATION:  ED                                   FACILITY:  [facility]   PHYSICIAN:  Edward L. Juanetta Gosling, M.D.             DATE OF BIRTH:  07/05/1933   DATE OF PROCEDURE:  DATE OF DISCHARGE:  11/12/2003                                EKG INTERPRETATION   FINDINGS:  1. The rhythm is a supraventricular tachycardia with a rate of about 130.  2. It may well be atrial flutter as discussed by the computer.  3. There are Q waves seen in the inferior leads, but they are small, and     maybe of no significance.  4. There are ST-T wave changes fairly diffusely.   IMPRESSION:  Abnormal electrocardiogram.      ___________________________________________                                            Oneal Deputy. Juanetta Gosling, M.D.   Gwenlyn Found  D:  11/13/2003  T:  11/13/2003  Job:  [id number]

## 2010-08-23 NOTE — Consult Note (Signed)
NAME:  Jared Santos, Jared Santos                       ACCOUNT NO.:  [id number]   MEDICAL RECORD NO.:  [id number]                  PATIENT TYPE:   LOCATION:                                       FACILITY:   PHYSICIAN:  R. Roetta Sessions, M.D.              DATE OF BIRTH:  September 23, 1934   DATE OF CONSULTATION:  07/20/2003  DATE OF DISCHARGE:                                   CONSULTATION   REASON FOR CONSULTATION:  1. Need for colorectal cancer screening.  2. Right lower quadrant abdominal pain.   [name] is a pleasant 76 year old African-American male  referred over to [place] per Dr. Dorthey Sawyer to further evaluate off and on right  lower quadrant abdominal pain for several months.  He has a tendency towards  constipation, has never passed any gross blood per rectum or melena.  Right  lower quadrant pain comes and goes, it is vague, it has never incapacitated  him.  It has not been associated with any nausea, vomiting or fever.  He  notes it slightly more when he bends over.  He has not noticed any bulges in  the skin, etc.  CT of the abdomen and pelvis reportedly revealed normal  appearing appendix, otherwise no significant abnormalities.  He was noted to  have a left lower lobe nodule in the abdominal portion and a CT of the chest  was recommended.  Apparently, this lesion was confirmed and a three month  follow-up CT is planned.  There is no family history of colorectal  neoplasia.  He has never had his lower GI tract imaged previously.  He  denies odynophagia, dysphagia, __________ reflux symptoms of nausea,  vomiting.  Weight has been stable.   PAST MEDICAL HISTORY:  Significant for type 2 diabetes mellitus.   PAST SURGERIES:  He has some type of neck surgery previously.   CURRENT MEDICATIONS:  1. Glucophage 500 mg b.i.d.  2. Glucotrol 5 mg b.i.d.  3. ASA 81 mg daily.  4. Tylenol p.r.n.   ALLERGIES:  NO KNOWN DRUG ALLERGIES.   FAMILY HISTORY:  Mother and father died of  old age.  Gives no history of  chronic GI or liver illness in the family.  He does have four brothers with  head and neck cancer.   SOCIAL HISTORY:  The patient has been married for 45 years.  He has had five  children.  He is retired as a Naval architect from __________.  No tobacco, no  alcohol.   PHYSICAL EXAMINATION:  Was a very pleasant 76 year old gentleman resting  comfortably.  VITALS:  Weight 210.5, height 5 feet 11 inches, temp 97.4, BP 148/72, pulse  62, skin warm and dry.  HEENT EXAM:  No scleral icterus.  Conjunctiva are pink, JVD is not  prominent.  CHEST/LUNGS:  Are clear to auscultation.  CARDIAC EXAM:  Regular rate and rhythm without murmur, gallop or rub.  ABDOMEN:  Nondistended, positive bowel sounds, soft.  He has vague right  lower quadrant tenderness to palpation.  No appreciable mass, organomegaly.  I do not appreciate a hernia.  EXTREMITIES:  Have no edema.  RECTAL EXAM:  Deferred until time of colonoscopy.   IMPRESSION:  [name] is a pleasant 76 year old gentleman  with vague right lower quadrant abdominal pain.  Reportedly, a CT of the  abdomen and pelvis revealed no cause for his symptoms.  This does not appear  to be causing him much distress at this time.  Physical examination is  pretty unremarkable.   He has never had  his lower GI tract imaged.  He certainly needs to have a  colonoscopy for at least colorectal cancer screening purposes at this time.  I discussed this approach with Jared Santos.  The potential risks, benefits,  and alternatives have been reviewed and questions answered.  He is  agreeable.  Will plan for him to have a colonoscopy in the very near future.  Will make further recommendations once that procedure has been completed.   I thank Dr. Dorthey Sawyer for allowing me to see this nice gentleman today.      ___________________________________________                                            Jared Santos, M.D.    Jared Santos/MEDQ  D:  07/20/2003  T:  07/20/2003  Job:  [id number]   cc:   Corrie Mckusick, M.D.  [address] [place] [place]  [place]  [place] [address]  Fax: [phone]

## 2010-08-23 NOTE — Op Note (Signed)
NAME:  Jared Santos, Jared Santos NO.:  [id number]   MEDICAL RECORD NO.:  [id number]                   PATIENT TYPE:  INP   LOCATION:  3715                                 FACILITY:  [facility]   PHYSICIAN:  Doylene Canning. Ladona Ridgel, M.D.               DATE OF BIRTH:  07/05/1933   DATE OF PROCEDURE:  11/21/2003  DATE OF DISCHARGE:                                 OPERATIVE REPORT   PROCEDURE PERFORMED:  Invasive electrophysiologic study and radio frequency  catheter ablation of atrial flutter.   INTRODUCTION:  The patient is a 76 year old man with a history of coronary  artery disease status post recent angioplasty who developed spontaneous  atrial flutter with rapid ventricular response, his rate controlled on IV  calcium channel blockers.  The patient desires not to take life-long  Coumadin therapy, having had a history of cerebral aneurysm.  He is now  referred for electrophysiologic study and catheter ablation.   DESCRIPTION OF PROCEDURE:  After informed consent was obtained, the patient  was taken to the diagnostic electrophysiology laboratory in a fasted state.  After the usual preparation and draping, intravenous fentanyl and Midazolam  was given for sedation.  A 6 French hexapolar catheter was inserted  percutaneously into the right jugular vein and advanced to the coronary  sinus.  A 7 French quadripolar 20-pole halo catheter was inserted  percutaneously into the right femoral vein and advanced to the right atrium.  A 5 French quadripolar catheter was inserted percutaneously into the right  femoral vein and advanced to the His bundle region.  After measurement of  the basic intervals, mapping was carried out demonstrating typical  counterclockwise tricuspid annular re-entrant atrial flutter.  The ablation  catheter was then maneuvered into the usual atrial flutter isthmus.  Initially, three radio frequency energy applications were delivered which  resulted in  termination of atrial flutter and restoration of sinus rhythm.  Pacing was then carried out by way of the coronary sinus demonstrating  residual isthmus conduction.  An additional 6 radio frequency energy  applications were delivered, resulting in the creation of isthmus block.  Two bonus radio frequency energy applications were delivered and the patient  was observed for 30 minutes at which time there was no evidence of any  residual isthmus conduction.  The catheter was then removed.  Hemostasis was  then assured and the patient returned to his room in satisfactory condition.   COMPLICATIONS:  There were no immediate procedural complications.   RESULTS:  a.  Baseline electrocardiogram.  The baseline electrocardiogram  demonstrates typical atrial flutter with a variable AV conduction.  b.  Baseline intervals.  The atrial flutter cycle length was 200 msec.  The  HV interval was 30 msec.  The QRS duration was 90 msec.  Following catheter  ablation, HV interval was unchanged and the sinus node cycle length was 867  msec.  c.  Rapid ventricular pacing.  Following catheter ablation, rapid  ventricular pacing was carried out from the right ventricular apex  demonstrating a VA Wenckebach cycle length of 500 msec.  During rapid  ventricular pacing, the atrial activation was midline and decremental.  d.  Programmed ventricular stimulation.  Programmed ventricular stimulation  was carried out from the right ventricular apex at a basic drive cycle  length of 829 msec.  The S1-S2 interval was stepwise decreased down to 340  msec where the retrograde AV node ERP was observed.  During programmed  ventricular stimulation the atrial activation was again midline and  decremental.  e.  Rapid atrial pacing.  Rapid atrial pacing was carried out from the  coronary sinus as well as the high right atrium with pace cycle length of  600 msec and stepwise decreased down to 200 msec.  During rapid atrial  pacing  the AV Wenckebach cycle length was 320 msec.  During rapid atrial  pacing the P-R interval was less than the R-R interval and there was no  inducible SVT following catheter ablation.  f.  Programmed atrial stimulation.  Programmed atrial stimulation was  carried out from the coronary sinus as well as the high right atrium with a  basic drive cycle length of 562 msec.  The S1-S2 interval was stepwise  decreased from 440 msec down to 200 msec where AV conduction remained  intact.  g.  Arrhythmias observed.  Atrial flutter.  Initiation present at time of  electrophysiologic study, duration was sustained.  Cycle length was 200  msec.  Method of termination was catheter ablation.  h.  Mapping.  Mapping was carried out demonstrating typical counterclockwise  tricuspid annular re-entrant atrial flutter.  a. Radio frequency energy application.  A total of 11 radio frequency energy     applications were delivered.  During the third radio frequency energy     application, atrial flutter was terminated and sinus rhythm restored.     During the 9th radio frequency energy application, isthmus block was     created.  Two bonus radio frequency energy applications were then     delivered.   CONCLUSION:  This study demonstrated successful electrophysiologic study and  radio frequency catheter ablation of typical atrial flutter with a total of  11 RF energy applications delivered to the usual atrial flutter isthmus,  resulted in the termination of atrial flutter, restoration of sinus rhythm,  and creation of bidirectional block in the atrial flutter isthmus.                                               Doylene Canning. Ladona Ridgel, M.D.    GWT/MEDQ  D:  11/21/2003  T:  11/21/2003  Job:  [id number]   cc:   Kirk Ruths, M.D.  [address]  [place]  [place] [address]  Fax: [phone]   Salvadore Farber, M.D. [facility]  [address]  [address]  [place]  [place] [address]

## 2010-08-23 NOTE — Op Note (Signed)
NAME:  VIRL, COBLE NO.:  [id number]   MEDICAL RECORD NO.:  [id number]                   PATIENT TYPE:  INP   LOCATION:  3715                                 FACILITY:  [facility]   PHYSICIAN:  Duke Salvia, M.D.               DATE OF BIRTH:  07/05/1933   DATE OF PROCEDURE:  11/20/2003  DATE OF DISCHARGE:                                 OPERATIVE REPORT   Thank you very much for asking me to see Mr. Henderson Frampton in  electrophysiological consultation for typical atrial flutter.   Mr. Bayron is a 76 year old gentleman who presented with intermittent chest  pain and was found to have a non-Q-wave myocardial infarction and  subsequently referred for catheterization and noted to have high grade RCA  disease with a marginal occlusion.  This was stented under the Trident  protocol.  He had residual LAD and circumflex and left main disease in the  40 to 50% range, had overall normal left ventricular function.   In the postprocedural time, he ended up with atrial flutter happening  recurrently.  On further evaluation, he had recurrent tachy palpitations in  the past.  These are not particularly associated with exercise intolerance.   He does not have a history of heart failure.   His thromboembolic risk factors are notable for age and diabetes but no  hypertension and no prior strokes.   CURRENT MEDICATIONS:  1. Plavix 75.  2. Glucotrol 5 b.i.d.  3. Toprol 100.  4. Zocor 20.  5. Coumadin.  6. Glucophage 500 q.12h.  7. Aspirin 81.  8. Altace 2.5   ALLERGIES:  No known drug allergies.   PAST MEDICAL HISTORY:  1. 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  The details of this are not available but he has a scar in his     right neck where they clamped his veins.  2. He also has a remote history of tuberculosis.   REVIEW OF SYMPTOMS:  Also noted on the intake sheet from November 16, 2003,  and is not further  recounted here.   SOCIAL HISTORY:  He is married.  He has four children.  He does not smoke or  use alcohol or drugs.  He is a retired [employer].   PHYSICAL EXAMINATION:  GENERAL APPEARANCE:  He is an elderly African  American male appearing younger than his stated age of 76.  VITAL SIGNS:  His blood pressure was __________ with pulse of 79 and  irregular.  Respiratory rate 14 and unlabored.  We do not have an available  weight.  HEENT:  No icterus or xanthomata.  The neck veins were flat with a scar on  his right neck as noted.  LUNGS:  Clear.  CARDIOVASCULAR:  Heart sounds were regular without murmurs or gallops.  ABDOMEN:  Soft with active bowel sounds.  There was no midline pulsation or  hepatomegaly.  EXTREMITIES:  Femoral pulses were 2+, distal pulses were intact.  There were  no clubbing, cyanosis, or edema.  NEUROLOGIC:  Examination was grossly normal.  The skin was warm and dry.   Electrocardiogram dated November 16, 2003, demonstrated sinus rhythm at 93  with intervals of 0.18/0.10/0.35 with an axis of 41 degrees.  There was  evidence of a prior inferior wall MI.  There was isolated PAC.   Electrocardiogram from November 18, 2003, demonstrated typical atrial flutter  with an atrial cycle length of 300 msec.   IMPRESSION:  1. Atrial flutter with atrial cycle length of 300 msec with a controlled     ventricular response.  2. Thromboembolic risk factors notable for:     a. Age.     b. Diabetes.  3. Coronary artery disease .     a. Status post right coronary artery stenting.     b. Non-Q-wave myocardial infarction.     c. Residual moderate disease in the left-sided circulation.     d. Normal left ventricular function.  4. Cerebral aneurysm, status post clipping 30 years ago.  5. History of tuberculosis.   DISCUSSION:  Mr. Gappa has atrial flutter with thromboembolic risk factors  as noted above.  The alternatives would be medical management or RF catheter  ablation.   Data supporting the cost effectiveness of atrial flutter ablation  is primary therapy as continues to increase.  In addition, we anticipate  that it would significantly reduce his thromboembolic risk. I have reviewed  with him the potential benefits as well as potential risks of the procedure  including but not limited to death, perforation and heart block.  He  understands these risks and is willing to proceed.   We will plan to continue his heparin and Coumadin and proceed with RF  catheter ablation tomorrow under the care of Dr. Ladona Ridgel.                                               Duke Salvia, M.D.    SCK/MEDQ  D:  11/20/2003  T:  11/20/2003  Job:  [id number]   cc:   Kirk Ruths, M.D.  [address]  [place]  [place] [address]  Fax: [phone]   [employer]

## 2010-08-23 NOTE — Discharge Summary (Signed)
NAME:  Jared Santos, VANDERWEELE NO.:  [id number]   MEDICAL RECORD NO.:  [id number]                   PATIENT TYPE:  INP   LOCATION:  3715                                 FACILITY:  [facility]   PHYSICIAN:  Doylene Canning. Ladona Ridgel, M.D.               DATE OF BIRTH:  07/05/1933   DATE OF ADMISSION:  11/16/2003  DATE OF DISCHARGE:  11/22/2003                                 DISCHARGE SUMMARY   DISCHARGE DIAGNOSES:  1. Admitted with ST-elevation myocardial infarction.  He is status post     stenting of a 99% mid right coronary artery lesion, ejection fraction of     70% at catheterization, no aortic stenosis, no mitral regurgitation.     [facility]enith troponin I at [facility], November 17, 2003, at 1350 hours     was 10.42.  2. Post-procedure atrial flutter, controlled at first with intravenous     Cardizem.  He is status post ablation of the sub-eustachian isthmus,     returning the patient to a sinus rhythm on November 21, 2003, by Dr. Doylene Canning. Ladona Ridgel.  3. Started on Coumadin, this hospitalization.   SECONDARY DIAGNOSES:  1. Diabetes, controlled with oral agents.  2. Hypertension.  3. Dyslipidemia.   PROCEDURE:  1. November 16, 2003, ejection fraction 70%; LAD had a mid 60% stenosis and     distally 40%; left circumflex had a 40% proximal stenosis; right coronary     artery had 99% stenosis at the second acute marginal; this was reduced     with stenting, November 18, 2003, Dr. Salvadore Farber.  2. Ablation of atrial flutter by Dr. Lewayne Bunting, returning patient to     sinus rhythm.  The patient has had no post-ablation complications.   MEDICATIONS:  He goes home with the following medications:  1. He is enrolled in the TRITON Study.  2. Enteric-coated aspirin 81 mg daily (both work to keep the stent open).  3. Glucotrol 4 mg daily.  4. Altace 2.5 mg daily.  5. Zocor 20 mg daily at bedtime.  6. Glucophage 500 mg twice daily, restarting Friday, November 24, 2003.  7.  Coumadin 5 mg tabs 1/2 tablet Tuesday, Thursday, Saturday and Sunday, 1     tablet on Monday/Wednesday/Friday.   FOLLOWUP:  He has the following appointments:  1. He is to follow up with [facility], [facility], [place]     office, Monday, November 27, 2003, at 9:30.  2. Office visit with [facility], [place] office, post cath visit,     Wednesday, December 06, 2003, at 2 o'clock.  3. Follow up with Dr. Lewayne Bunting, [facility] on [address],     [place], Thursday, January 04, 2004, at 10:30 in the morning.  4. If he plans dental work or a surgical procedure before February 22, 2004,     he is  to call the office at [phone] for proper antibiotic instructions.   BRIEF HISTORY:  Jared Santos is a 76 year old male who has had several days of  intermittent chest discomfort.  He went to the emergency room at [facility]  3 days prior to this admission -- that would be November 13, 2003 -- and re-  presented today with the same discomfort.  Troponin at presentation was  elevated at 0.27 (but would eventually rise to greater than 10.4).  He was  transferred for cardiac catheterization.  Upon arrival, he had ongoing chest  pain and was brought urgently to the cardiac catheterization lab.   HOSPITAL COURSE:  PROBLEM #1 -- ST-ELEVATION MYOCARDIAL INFARCTION:  The  patient was admitted for urgent left heart catheterization, had culprit  lesion in the mid RCA; this was successfully stenting.  Post procedure, he  developed atrial flutter; this was rate-controlled with IV Cardizem.  The  patient was seen by Dr. Lewayne Bunting for electrophysiology procedure which  would consist of ablation of a typical atrial flutter.  Upon  electrophysiology study, his flutter was found to be typical and a sub-  eustachian radiofrequency ablation was performed with conversion to sinus  rhythm.  The patient is going home post-conversion day #1 on Coumadin.  He  will follow up at the  C[facility]madin Clinic, follow up for post-catheterization  check, he will follow up with Dr. Ladona Ridgel for post-ablation study.      Maple Mirza, P.A.                    Doylene Canning. Ladona Ridgel, M.D.    GM/MEDQ  D:  11/22/2003  T:  11/23/2003  Job:  [id number]   cc:   Doylene Canning. Ladona Ridgel, M.D.   [place], [place] [facility]   Kirk Ruths, M.D.  [address]  [place]  [place] [address]  Fax: [phone]

## 2010-08-23 NOTE — Assessment & Plan Note (Signed)
[facility]                       [place] [facility] OFFICE NOTE   Jared Santos, Jared Santos                       MRN:          [id number]  DATE:05/15/2006                            DOB:          07/05/1933    May 15, 2006   Jared Santos is seen at the request of Dr. Evlyn Courier.  He has been  having recurrent chest pains.  The patient has a history of coronary  artery disease.  He was treated for an acute ST segment elevation  myocardial infarction involving the inferior wall in 2005.   He has not had a close follow-up since that time.  He had some residual  60% mid stenosis in his LAD and 40% circumflex stenosis.  His LV  function was normal.   The patient's chest pain is occasionally exertional, however, does not  last long.  He does not have any nitroglycerin at home.  He has not had  any sustained episodes.  There has been no associated diaphoresis,  palpitations or syncope.   The patient's chest pains occur maybe once or twice a week.   He is still active.  He works three days a week at [employer].   His review of systems is otherwise benign.  He has not had any  significant shortness of breath, PND, orthopnea, palpitations or  syncope.   The patient has a history of diabetes.  He is, however, not on oral  medication at this time.  He was complaining about the cost of his  Hyzaar medicine for hypertension.  I told him we could change this to  lisinopril or hydrochlorothiazide.  He is taking an aspirin a day.   He denies any allergies.   He has had some sort of previous surgery involving his right neck, as  far as I can tell, it was for an aneurysm.   The patient has not had any TIA-like symptoms.   Other than his MI and his aneurysm surgery, he has been relatively  stable.  His coronary risk factors include diabetes, hypertension and  dyslipidemia.   FAMILY HISTORY:  Noncontributory.   He is happily married.  His  wife's health is good.  He has a son who  just moved in with him after a divorce.  He works three days at [employer].  He has been retired from [employer].  He does not smoke or  drink.   MEDICATIONS:  1. Glipizide 20 mg b.i.d.  2. Aspirin a day.  3. Altace or Hyzaar.  4. Plavix 75 a day.  5. Simvastatin 20 a day.   PHYSICAL EXAMINATION:  VITAL SIGNS:  Blood pressure 150/70, pulse 82 and  regular.  HEENT:  Normal.  NECK:  There is a question of a faint carotid bruit.  LUNGS:  Clear.  CARDIOVASCULAR:  S1 and S2, normal heart sounds.  ABDOMEN:  Benign.  EXTREMITIES:  Lower extremity pulses intact.  No edema.   IMPRESSION:  Previous inferior wall myocardial infarction with stenting  of the right coronary artery.  No follow-up since with recurrent  atypical  chest pain.  He will come back to the office to have a stress  Myoview with me.  At that time, we will see what his heart rate and  blood pressure response is.  I gave him a prescription for  lisinopril/hydrochlorothiazide instead of his Hyzaar to save him some  money.   He will continue his Simvastatin.  We will try to see if Dr. Loleta Chance has  recent lipid and liver profile and also a hemoglobin A1c.   Patient's blood pressure is reasonably controlled.   Hopefully, his stress test will be nonischemic.   The patient was given a prescription for sublingual nitroglycerin since  he does not have any.   Further recommendation will be based on the results of his Myoview.   Since there is a question of previous clamping of his right neck and he  has a history of a bruit, I think we will do a carotid Duplex.   However, it sounds more like he may have had an intracerebral aneurysm  clip.     Noralyn Pick. Eden Emms, MD, [facility]  Electronically Signed    PCN/MedQ  DD: 05/15/2006  DT: 05/15/2006  Job #: [id number]

## 2010-08-23 NOTE — Op Note (Signed)
NAME:  Jared Santos, Jared Santos                          ACCOUNT NO.:  [id number]   MEDICAL RECORD NO.:  [id number]                   PATIENT TYPE:  AMB   LOCATION:  DAY                                  FACILITY:  [facility]   PHYSICIAN:  R. Roetta Sessions, M.D.              DATE OF BIRTH:  07/05/1933   DATE OF PROCEDURE:  07/28/2003  DATE OF DISCHARGE:                                 OPERATIVE REPORT   PROCEDURE:  Colonoscopy with biopsy.   INDICATIONS:  The patient is a 76 year old gentleman with vague intermittent  right lower quadrant abdominal pain.  A CT scan of the abdomen and pelvis  was negative.  He has never had a colonoscopy.  He is coming for colonoscopy  for colorectal cancer screening purposes.  This approach has been discussed  with the patient previously at length and again at the bedside.  The  potential risks, benefits and alternatives have been reviewed.  Please see  July 20, 2003 dictation procedure note.   DESCRIPTION OF PROCEDURE:  Oxygen saturation, blood pressure, pulse and  respiration were monitored throughout the entire procedure.  Conscious  sedation with Versed 3 mg IV and Demerol 75 mg IV in divided doses.  The  instrument was the Olympus video tip colonoscope.   FINDINGS:  Digital rectal exam revealed no abnormalities.   ENDOSCOPIC FINDINGS:  Prep was adequate but suboptimal on the right side.   Rectum:  Examination of the rectal mucosa including retroflexion in the anal  verge revealed a 3 mm diminutive polyp 5 cm from the anal verge.  The  remainder of the rectal mucosa appeared normal.   Colon:  Colonic mucosa was surveyed from the rectosigmoid junction to the  left, transverse,  right colon to the area of the appendiceal orifice,  ileocecal valve and cecum.  A thin coating of stool __________ made exam  more difficult.  However, after some washing I got a good look at the  mucosa.  The appendiceal orifice, cecum, and ileocecal valve appeared  normal.  From  this level, the scope was slowly withdrawn and the previously  mentioned mucosal surfaces were again seen and the colonic mucosa appeared  normal.  The diminutive polyp in the rectum was cold biopsied/removed.  The  patient tolerated the procedure well and was reactive after endoscopy.   IMPRESSION:  1. Diminutive rectal polyp, cold biopsied/removed; otherwise normal rectum.  2. Normal-appearing colonic mucosa.  3. No explanation for the patient's right lower quadrant abdominal pain.  He     does have constipation on a regular basis.   RECOMMENDATIONS:  1. Constipation literature given to Jared Santos.  2. Daily fiber, Metamucil or Citrucel fiber supplement __________ fluids.  3. Will see this nice gentleman back in six weeks and see how he is doing.      ___________________________________________  Jonathon Bellows, M.D.   RMR/MEDQ  D:  07/28/2003  T:  07/29/2003  Job:  [id number]   cc:   Corrie Mckusick, M.D.  [address] [place]urell Josephs. [place]  [place]  [place] [address]  Fax: [phone]

## 2010-08-23 NOTE — Cardiovascular Report (Signed)
NAME:  ABIJAH, ROUSSEL NO.:  [id number]   MEDICAL RECORD NO.:  [id number]                   PATIENT TYPE:  INP   LOCATION:  2922                                 FACILITY:  [facility]   PHYSICIAN:  Salvadore Farber, M.D. [facility]         DATE OF BIRTH:  07/05/1933   DATE OF PROCEDURE:  11/16/2003  DATE OF DISCHARGE:                              CARDIAC CATHETERIZATION   PROCEDURES:  Left heart catheterization, left ventriculogram, coronary  angiography, drug-eluting stent placement in the mid-right coronary artery.   INDICATIONS:  Mr. Leask is a 76 year old gentleman who has had several days  of intermittent chest discomfort.  He was sent home from the [facility]  emergency room three days ago and re-presented today with the same  discomfort.  Troponin was elevated at 0.27, and he was transferred for  cardiac catheterization.  Upon arrival, he had ongoing chest pain and was  brought urgently to the cardiac catheterization lab.   PROCEDURAL TECHNIQUE:  Informed consent was obtained.  Under 1% lidocaine  local anesthesia, a 6 French sheath was placed in the right common femoral  artery using the modified Seldinger technique.  Diagnostic angiography and  ventriculography were performed using JL4, JR4, and pigtail catheters.  The  case then turned to intervention.   The culprit lesion was 99% stenosis of the mid-RCA.  Anticoagulation was  continued with the Integrilin and heparin that he had arrived in the  catheterization lab on.  Additional heparin was given to achieve and  maintain an ACT of greater than 200 seconds.  A 6 [place] ART 3.5 guide was  advanced over a wire and engaged in the ostium of the RCA.  A Luge wire was  advanced with  moderate difficulty beyond the lesion into the distal RCA.  A  2.0 x 9 mm Maverick balloon was then advanced with moderate difficulty  across the lesion and used to pre-dilate.  The tortuosity at the initial  portion of the  vessel provided substantial resistance.  The vessel was pre-  dilated at 4 atmospheres.  With pre-dilation there was watermelon seeding of  the balloon both proximally and distally.  Due to concern for vessel injury  due to this, I chose a longer stent.  I then attempted to deliver a 3.5 x 20  mm Taxus.  I was unable to deliver it through the tortuous segment.  In  doing so, guide and wire became dislodged. I then up-sized to a 7 [place]  sheath and 7 [place] ART 4 guide.  A Luge wire was advanced across the lesion  without difficulty.  A Voyager balloon was then manipulated beyond the  lesion into the distal vessel.  Wire was removed and a Sport wire advanced  through the balloon's lumen to the distal vessel.  With this added support,  I was able to deliver the 3.5 x 20 mm Taxus stent.  It was positioned  to  cover the entirety of the injured segment.  It was deployed at 14  atmospheres.  With stent deployment the patient complained of severe (10/10)  substernal chest discomfort.  Flow to the distal RCA remained TIMI-3.  All  branches were well-perfused with the sole exception of a jailed moderate-  sized acute marginal (1.5-2 mm).  This had no flow whatsoever and could not  be seen.  The patient had marked ST elevations on monitor.  Pain was treated  with morphine, Versed, and both intracoronary and intravenous nitroglycerin.  There was modest improvement in his pain with this.  I made attempts to wire  the marginal with both a Luge wire and a Whisper wire to no avail.  The  stent had changed the geometry of the vessel, and I could not see the origin  whatsoever due to a lack of any dye staining.  Electrocardiogram was  obtained, which demonstrated ST elevations in V1 greater than V2.  While I  thought it was due to the acute marginal, I did proceed to repeat  angiography of the left system after removing the guide and wire.  This  confirmed no change in the moderate disease in the left.  The  patient was  treated with further morphine and transferred to the holding room in stable  condition.  He remained hemodynamically stable throughout the case.   COMPLICATIONS:  Occlusion of acute marginal with resultant chest pain and ST  elevations.   FINDINGS:  1. LV:  153/0/9.  EF:  70% without regional wall motion abnormality.  No     aortic stenosis or MR.  2. Left main:  Ostial 40-50% stenosis.  3. LAD:  Moderate-sized vessel giving rise to a single large diagonal.  The     mid-LAD has a 60% stenosis.  The distal vessel has a 40% stenosis.  4. Circumflex:  A moderate-sized vessel giving rise to a single large     branching obtuse marginal.  This marginal has a 40% stenosis proximally.  5. RCA:  Large, dominant vessel.  The vessel is extremely tortuous     proximally.  After the second acute marginal, there is a 99% stenosis.     There is then a 50% stenosis beyond the acute margin.  As described     above, the 99% culprit lesion was stented to no residual with loss of a     small acute marginal.   IMPRESSION/PLAN:  Successful stenting of the culprit lesion in the mid-right  coronary artery.  The procedure was complicated by occlusion of a moderate-  sized acute marginal, which resulted in severe chest pain and ST elevation.  There was no hemodynamic embarrassment.  Flow to the remainder of the right  coronary artery territory remains excellent.  Will expect a small controlled  infarct.  The patient will be treated with morphine for pain control.  Will  continue eptifibatide for 18 hours.  Aspirin will be continued indefinitely.  The patient has been enrolled in the Triton study comparing Plavix with a  novel thienopyradine.  He thus should not receive open-label Plavix.  This  study drug will be continued for a year.                                               Salvadore Farber, M.D. [facility]  WED/MEDQ  D:  11/16/2003  T:  11/17/2003  Job:  [id number]   cc:   Kirk Ruths,  M.D.  [address]  [place]  [place] [address]  Fax: [phone]

## 2010-12-24 ENCOUNTER — Emergency Department (HOSPITAL_COMMUNITY): Payer: Medicare Other

## 2010-12-24 ENCOUNTER — Inpatient Hospital Stay (HOSPITAL_COMMUNITY)
Admission: EM | Admit: 2010-12-24 | Discharge: 2010-12-27 | DRG: 309 | Disposition: A | Discharge status: Home / Self Care | Payer: Medicare Other | Attending: Internal Medicine | Admitting: Internal Medicine

## 2010-12-24 DIAGNOSIS — I5022 Chronic systolic (congestive) heart failure: Secondary | ICD-10-CM | POA: Diagnosis present

## 2010-12-24 DIAGNOSIS — E669 Obesity, unspecified: Secondary | ICD-10-CM | POA: Diagnosis present

## 2010-12-24 DIAGNOSIS — Z8249 Family history of ischemic heart disease and other diseases of the circulatory system: Secondary | ICD-10-CM

## 2010-12-24 DIAGNOSIS — E785 Hyperlipidemia, unspecified: Secondary | ICD-10-CM | POA: Diagnosis present

## 2010-12-24 DIAGNOSIS — I2510 Atherosclerotic heart disease of native coronary artery without angina pectoris: Secondary | ICD-10-CM | POA: Diagnosis present

## 2010-12-24 DIAGNOSIS — Z951 Presence of aortocoronary bypass graft: Secondary | ICD-10-CM

## 2010-12-24 DIAGNOSIS — Z87891 Personal history of nicotine dependence: Secondary | ICD-10-CM

## 2010-12-24 DIAGNOSIS — I6529 Occlusion and stenosis of unspecified carotid artery: Secondary | ICD-10-CM | POA: Diagnosis present

## 2010-12-24 DIAGNOSIS — Z79899 Other long term (current) drug therapy: Secondary | ICD-10-CM

## 2010-12-24 DIAGNOSIS — I4891 Unspecified atrial fibrillation: Principal | ICD-10-CM | POA: Diagnosis present

## 2010-12-24 DIAGNOSIS — Z8611 Personal history of tuberculosis: Secondary | ICD-10-CM

## 2010-12-24 DIAGNOSIS — I2589 Other forms of chronic ischemic heart disease: Secondary | ICD-10-CM | POA: Diagnosis present

## 2010-12-24 DIAGNOSIS — I10 Essential (primary) hypertension: Secondary | ICD-10-CM | POA: Diagnosis present

## 2010-12-24 DIAGNOSIS — Z7982 Long term (current) use of aspirin: Secondary | ICD-10-CM

## 2010-12-24 DIAGNOSIS — Z833 Family history of diabetes mellitus: Secondary | ICD-10-CM

## 2010-12-24 DIAGNOSIS — I252 Old myocardial infarction: Secondary | ICD-10-CM

## 2010-12-24 DIAGNOSIS — Z9861 Coronary angioplasty status: Secondary | ICD-10-CM

## 2010-12-24 DIAGNOSIS — IMO0001 Reserved for inherently not codable concepts without codable children: Secondary | ICD-10-CM | POA: Diagnosis present

## 2010-12-24 LAB — DIFFERENTIAL
Basophils Relative: 0 % (ref 0–1)
Eosinophils Absolute: 0.2 10*3/uL (ref 0.0–0.7)
Lymphs Abs: 2.2 10*3/uL (ref 0.7–4.0)
Neutro Abs: 1.7 10*3/uL (ref 1.7–7.7)
Neutrophils Relative %: 36 % — ABNORMAL LOW (ref 43–77)

## 2010-12-24 LAB — CBC
Hemoglobin: 16.5 g/dL (ref 13.0–17.0)
MCV: 86.2 fL (ref 78.0–100.0)
Platelets: 122 10*3/uL — ABNORMAL LOW (ref 150–400)
RBC: 5.35 MIL/uL (ref 4.22–5.81)
WBC: 4.7 10*3/uL (ref 4.0–10.5)

## 2010-12-24 LAB — POCT I-STAT, CHEM 8
Calcium, Ion: 1.19 mmol/L (ref 1.12–1.32)
Chloride: 105 mEq/L (ref 96–112)
HCT: 51 % (ref 39.0–52.0)
Sodium: 142 mEq/L (ref 135–145)

## 2010-12-24 LAB — D-DIMER, QUANTITATIVE: D-Dimer, Quant: 0.82 ug/mL-FEU — ABNORMAL HIGH (ref 0.00–0.48)

## 2010-12-24 LAB — PRO B NATRIURETIC PEPTIDE: Pro B Natriuretic peptide (BNP): 2136 pg/mL — ABNORMAL HIGH (ref 0–450)

## 2010-12-24 LAB — POCT I-STAT TROPONIN I: Troponin i, poc: 0.06 ng/mL (ref 0.00–0.08)

## 2010-12-25 DIAGNOSIS — R0602 Shortness of breath: Secondary | ICD-10-CM

## 2010-12-25 DIAGNOSIS — R000 Tachycardia, unspecified: Secondary | ICD-10-CM

## 2010-12-25 LAB — TROPONIN I
Troponin I: <0.3 ng/mL (ref <0.30)
Troponin I: <0.3 ng/mL (ref <0.30)

## 2010-12-25 LAB — GLUCOSE, CAPILLARY
Glucose-Capillary: 188 mg/dL — ABNORMAL HIGH (ref 70–99)
Glucose-Capillary: 113 mg/dL — ABNORMAL HIGH (ref 70–99)

## 2010-12-25 LAB — COMPREHENSIVE METABOLIC PANEL
ALT: 18 U/L (ref 0–53)
AST: 20 U/L (ref 0–37)
Albumin: 3.7 g/dL (ref 3.5–5.2)
CO2: 30 mEq/L (ref 19–32)
Calcium: 9.7 mg/dL (ref 8.4–10.5)
Creatinine, Ser: 1.12 mg/dL (ref 0.50–1.35)
Sodium: 139 mEq/L (ref 135–145)
Total Protein: 7.3 g/dL (ref 6.0–8.3)

## 2010-12-25 LAB — CK TOTAL AND CKMB (NOT AT ARMC)
Relative Index: INVALID (ref 0.0–2.5)
Relative Index: INVALID (ref 0.0–2.5)
Total CK: 97 U/L (ref 7–232)
Total CK: 95 U/L (ref 7–232)

## 2010-12-25 LAB — LIPID PANEL
HDL: 42 mg/dL (ref >39)
LDL Cholesterol: 71 mg/dL (ref 0–99)
Total CHOL/HDL Ratio: 3.2 RATIO
Triglycerides: 117 mg/dL (ref <150)
VLDL: 23 mg/dL (ref 0–40)

## 2010-12-25 LAB — MAGNESIUM: Magnesium: 1.9 mg/dL (ref 1.5–2.5)

## 2010-12-25 LAB — CBC
Hemoglobin: 16.1 g/dL (ref 13.0–17.0)
MCH: 29.3 pg (ref 26.0–34.0)
Platelets: 127 10*3/uL — ABNORMAL LOW (ref 150–400)
RBC: 5.49 MIL/uL (ref 4.22–5.81)
WBC: 4.9 10*3/uL (ref 4.0–10.5)

## 2010-12-25 LAB — DIFFERENTIAL
Lymphocytes Relative: 40 % (ref 12–46)
Monocytes Absolute: 0.6 10*3/uL (ref 0.1–1.0)
Monocytes Relative: 11 % (ref 3–12)
Neutro Abs: 2.2 10*3/uL (ref 1.7–7.7)

## 2010-12-25 LAB — T4, FREE: Free T4: 1.38 ng/dL (ref 0.80–1.80)

## 2010-12-25 LAB — T3, FREE: T3, Free: 3 pg/mL (ref 2.3–4.2)

## 2010-12-25 MED ORDER — IOHEXOL 300 MG/ML  SOLN
100.0000 mL | Freq: Once | INTRAMUSCULAR | Status: AC | PRN
  Administered 2010-12-25: 100 mL via INTRAVENOUS

## 2010-12-26 LAB — GLUCOSE, CAPILLARY
Glucose-Capillary: 181 mg/dL — ABNORMAL HIGH (ref 70–99)
Glucose-Capillary: 221 mg/dL — ABNORMAL HIGH (ref 70–99)
Glucose-Capillary: 193 mg/dL — ABNORMAL HIGH (ref 70–99)
Glucose-Capillary: 255 mg/dL — ABNORMAL HIGH (ref 70–99)

## 2010-12-27 DIAGNOSIS — I359 Nonrheumatic aortic valve disorder, unspecified: Secondary | ICD-10-CM

## 2010-12-27 LAB — MAGNESIUM: Magnesium: 2 mg/dL (ref 1.5–2.5)

## 2010-12-27 LAB — BASIC METABOLIC PANEL
BUN: 24 mg/dL — ABNORMAL HIGH (ref 6–23)
CO2: 27 mEq/L (ref 19–32)
Calcium: 9.1 mg/dL (ref 8.4–10.5)
Chloride: 99 mEq/L (ref 96–112)
Creatinine, Ser: 1.35 mg/dL (ref 0.50–1.35)
GFR calc Af Amer: >60 mL/min (ref >60)

## 2010-12-27 LAB — GLUCOSE, CAPILLARY: Glucose-Capillary: 241 mg/dL — ABNORMAL HIGH (ref 70–99)

## 2010-12-30 LAB — POCT I-STAT 4, (NA,K, GLUC, HGB,HCT)
Glucose, Bld: 147 — ABNORMAL HIGH
Glucose, Bld: 78
Glucose, Bld: 79
HCT: 27 — ABNORMAL LOW
HCT: 28 — ABNORMAL LOW
HCT: 34 — ABNORMAL LOW
HCT: 46
Hemoglobin: 10.2 — ABNORMAL LOW
Hemoglobin: 10.2 — ABNORMAL LOW
Hemoglobin: 11.6 — ABNORMAL LOW
Hemoglobin: 15.6
Operator id: 257021
Operator id: 3291
Operator id: 3291
Operator id: 3291
Operator id: 3291
Operator id: 3291
Potassium: 4.5
Potassium: 4.6
Sodium: 133 — ABNORMAL LOW
Sodium: 139
Sodium: 140

## 2010-12-30 LAB — POCT I-STAT 3, ART BLOOD GAS (G3+)
Acid-Base Excess: 2
Acid-base deficit: 1
Acid-base deficit: 1
Acid-base deficit: 2
Bicarbonate: 23.9
Bicarbonate: 30.7 — ABNORMAL HIGH
O2 Saturation: 89
Operator id: 194801
Operator id: 209041
Operator id: 298181
Operator id: 3291
Patient temperature: 35.2
Patient temperature: 36.8
Patient temperature: 37.2
TCO2: 25
TCO2: 29
TCO2: 32
pCO2 arterial: 35.3
pCO2 arterial: 44.8
pH, Arterial: 7.409
pH, Arterial: 7.412
pH, Arterial: 7.426
pH, Arterial: 7.434
pH, Arterial: 7.444
pO2, Arterial: 371 — ABNORMAL HIGH

## 2010-12-30 LAB — CBC
HCT: 32.2 — ABNORMAL LOW
HCT: 47
HCT: 48
Hemoglobin: 11 — ABNORMAL LOW
Hemoglobin: 16
Hemoglobin: 16.5
MCHC: 34.1
MCHC: 34.5
MCHC: 34.7
MCHC: 34.8
MCHC: 34.9
MCHC: 35.4
MCV: 89
MCV: 89.9
Platelets: 120 — ABNORMAL LOW
Platelets: 124 — ABNORMAL LOW
Platelets: 124 — ABNORMAL LOW
Platelets: 126 — ABNORMAL LOW
Platelets: 148 — ABNORMAL LOW
Platelets: 199
RBC: 3.42 — ABNORMAL LOW
RBC: 3.78 — ABNORMAL LOW
RBC: 3.79 — ABNORMAL LOW
RBC: 5.18
RBC: 5.37
RBC: 5.58
RDW: 14.3
RDW: 14.7
RDW: 14.7
RDW: 14.7
RDW: 14.8
RDW: 15
WBC: 4.2
WBC: 5.3
WBC: 7.1
WBC: 8.8

## 2010-12-30 LAB — I-STAT 8, (EC8 V) (CONVERTED LAB)
Acid-base deficit: 1
Bicarbonate: 24.3 — ABNORMAL HIGH
Chloride: 105
HCT: 31 — ABNORMAL LOW
Operator id: 182771
TCO2: 26
pCO2, Ven: 43.5 — ABNORMAL LOW
pH, Ven: 7.354 — ABNORMAL HIGH

## 2010-12-30 LAB — BASIC METABOLIC PANEL
BUN: 14
BUN: 20
CO2: 25
CO2: 25
CO2: 25
CO2: 26
Calcium: 7.8 — ABNORMAL LOW
Calcium: 7.8 — ABNORMAL LOW
Calcium: 8 — ABNORMAL LOW
Calcium: 8 — ABNORMAL LOW
Calcium: 8.4
Calcium: 8.8
Chloride: 104
Chloride: 104
Creatinine, Ser: 1.02
Creatinine, Ser: 1.08
Creatinine, Ser: 1.08
Creatinine, Ser: 1.11
Creatinine, Ser: 1.24
GFR calc Af Amer: >60
GFR calc Af Amer: >60
GFR calc Af Amer: >60
GFR calc Af Amer: >60
GFR calc non Af Amer: 52 — ABNORMAL LOW
GFR calc non Af Amer: 54 — ABNORMAL LOW
GFR calc non Af Amer: >60
GFR calc non Af Amer: >60
Glucose, Bld: 104 — ABNORMAL HIGH
Glucose, Bld: 106 — ABNORMAL HIGH
Glucose, Bld: 129 — ABNORMAL HIGH
Glucose, Bld: 87
Potassium: 3.7
Sodium: 132 — ABNORMAL LOW
Sodium: 137
Sodium: 140
Sodium: 143

## 2010-12-30 LAB — URINALYSIS, MICROSCOPIC ONLY
Bilirubin Urine: NEGATIVE
Leukocytes, UA: NEGATIVE
Nitrite: NEGATIVE
Specific Gravity, Urine: 1.027
Urobilinogen, UA: 0.2
pH: 5.5

## 2010-12-30 LAB — ABO/RH: ABO/RH(D): B POS

## 2010-12-30 LAB — BLOOD GAS, ARTERIAL
FIO2: 0.21
O2 Saturation: 92.3
pCO2 arterial: 32.6 — ABNORMAL LOW
pH, Arterial: 7.445
pO2, Arterial: 63.2 — ABNORMAL LOW

## 2010-12-30 LAB — HEPARIN LEVEL (UNFRACTIONATED)
Heparin Unfractionated: 0.18 — ABNORMAL LOW
Heparin Unfractionated: 0.32
Heparin Unfractionated: 0.43

## 2010-12-30 LAB — MAGNESIUM
Magnesium: 2.7 — ABNORMAL HIGH
Magnesium: 2.8 — ABNORMAL HIGH

## 2010-12-30 LAB — APTT: aPTT: 28

## 2010-12-30 LAB — POCT I-STAT, CHEM 8
Calcium, Ion: 1.06 — ABNORMAL LOW
Creatinine, Ser: 1.7 — ABNORMAL HIGH
Glucose, Bld: 190 — ABNORMAL HIGH
HCT: 32 — ABNORMAL LOW
Hemoglobin: 10.9 — ABNORMAL LOW
Hemoglobin: 11.2 — ABNORMAL LOW
Sodium: 137
Sodium: 146 — ABNORMAL HIGH
TCO2: 24
TCO2: 26

## 2010-12-30 LAB — PROTIME-INR
INR: 1.1
INR: 1.5
Prothrombin Time: 15.8 — ABNORMAL HIGH
Prothrombin Time: 18.4 — ABNORMAL HIGH

## 2010-12-30 LAB — POCT I-STAT 3, VENOUS BLOOD GAS (G3P V)
Acid-base deficit: 1
Operator id: 194801
pCO2, Ven: 38.5 — ABNORMAL LOW
pCO2, Ven: 62.2 — ABNORMAL HIGH
pH, Ven: 7.231 — ABNORMAL LOW
pO2, Ven: 32

## 2010-12-30 LAB — CREATININE, SERUM
Creatinine, Ser: 0.97
Creatinine, Ser: 1.39
GFR calc Af Amer: >60
GFR calc Af Amer: >60

## 2010-12-30 LAB — POCT I-STAT GLUCOSE
Glucose, Bld: 174 — ABNORMAL HIGH
Operator id: 194801

## 2010-12-30 LAB — PLATELET COUNT: Platelets: 99 — ABNORMAL LOW

## 2010-12-30 LAB — CARDIAC PANEL(CRET KIN+CKTOT+MB+TROPI)
CK, MB: 2
CK, MB: 2.1
CK, MB: 2.4
Relative Index: INVALID
Relative Index: INVALID
Total CK: 71
Troponin I: 0.01
Troponin I: 0.02

## 2010-12-30 LAB — HEMOGLOBIN AND HEMATOCRIT, BLOOD: Hemoglobin: 10.6 — ABNORMAL LOW

## 2010-12-30 LAB — PREPARE PLATELET PHERESIS

## 2010-12-30 LAB — COMPREHENSIVE METABOLIC PANEL
Albumin: 3 — ABNORMAL LOW
Alkaline Phosphatase: 70
BUN: 16
Calcium: 8.5
Potassium: 3.8
Sodium: 134 — ABNORMAL LOW
Total Protein: 6

## 2010-12-30 LAB — TSH: TSH: 1.219

## 2010-12-30 LAB — B-NATRIURETIC PEPTIDE (CONVERTED LAB): Pro B Natriuretic peptide (BNP): 274 — ABNORMAL HIGH

## 2011-01-01 ENCOUNTER — Encounter: Payer: Self-pay | Admitting: Physician Assistant

## 2011-01-01 ENCOUNTER — Ambulatory Visit (INDEPENDENT_AMBULATORY_CARE_PROVIDER_SITE_OTHER): Payer: Medicare Other | Admitting: Physician Assistant

## 2011-01-01 ENCOUNTER — Encounter (INDEPENDENT_AMBULATORY_CARE_PROVIDER_SITE_OTHER): Payer: Medicare Other

## 2011-01-01 VITALS — BP 162/62 | Ht 69.0 in | Wt 193.0 lb

## 2011-01-01 DIAGNOSIS — I5032 Chronic diastolic (congestive) heart failure: Secondary | ICD-10-CM

## 2011-01-01 DIAGNOSIS — I2510 Atherosclerotic heart disease of native coronary artery without angina pectoris: Secondary | ICD-10-CM

## 2011-01-01 DIAGNOSIS — R0609 Other forms of dyspnea: Secondary | ICD-10-CM

## 2011-01-01 DIAGNOSIS — I509 Heart failure, unspecified: Secondary | ICD-10-CM

## 2011-01-01 DIAGNOSIS — I4891 Unspecified atrial fibrillation: Secondary | ICD-10-CM

## 2011-01-01 DIAGNOSIS — G4733 Obstructive sleep apnea (adult) (pediatric): Secondary | ICD-10-CM | POA: Insufficient documentation

## 2011-01-01 DIAGNOSIS — I10 Essential (primary) hypertension: Secondary | ICD-10-CM

## 2011-01-01 DIAGNOSIS — E785 Hyperlipidemia, unspecified: Secondary | ICD-10-CM

## 2011-01-01 DIAGNOSIS — R0683 Snoring: Secondary | ICD-10-CM

## 2011-01-01 DIAGNOSIS — R911 Solitary pulmonary nodule: Secondary | ICD-10-CM

## 2011-01-01 DIAGNOSIS — J984 Other disorders of lung: Secondary | ICD-10-CM

## 2011-01-01 MED ORDER — CARVEDILOL 6.25 MG PO TABS: ORAL_TABLET | ORAL | Status: DC

## 2011-01-01 NOTE — Assessment & Plan Note (Signed)
Somewhat uncontrolled.  Increase carvedilol to 9.375 mg twice daily.

## 2011-01-01 NOTE — Patient Instructions (Addendum)
Your physician recommends that you schedule a follow-up appointment in: 4-6 WEEKS WITH DR. MCDOWELL IN THE  [place] OFFICE  PER SCOTT WEAVER, PA-C  Your physician recommends that you return for lab work in: TODAY BMET/BNP 428.32 HEART FALIURE  You have been referred to PULMONOLOGY FOR LUNG NODULE AND TO SEE IF PT NEEDS POSSIBLE SLEEP STUDY FOR SNORING  Your physician has recommended that you wear an event monitor DX 427.31 AFIB. Event monitors are medical devices that record the heart's electrical activity. Doctors most often [place] these monitors to diagnose arrhythmias. Arrhythmias are problems with the speed or rhythm of the heartbeat. The monitor is a small, portable device. You can wear one while you do your normal daily activities. This is usually used to diagnose what is causing palpitations/syncope (passing out).  Your physician has recommended you make the following change in your medication: STOP TAKING THE 81 MG ASPIRIN AND START ASPIRIN 325 MG 1 TABLET DAILY; INCREASE COREG 6.25 MG TAKE 1 AND 1/2 (HALF) TABLETS TWICE DAILY

## 2011-01-01 NOTE — Assessment & Plan Note (Signed)
Refer to pulmonology for evaluation of his lung nodule.  He has a significant history of prior smoking.

## 2011-01-01 NOTE — Assessment & Plan Note (Signed)
He is currently in sinus rhythm.  He has a high thromboembolic risk factor profile.  His CHADS-VASc score is 5.  He would benefit from Coumadin.  However, his atrial fibrillation with brief.  He has a lung nodule.  It is unclear whether this will need invasive intervention/evaluation.  As noted, he will be referred to pulmonology.  I think, at this point, we should keep him on aspirin.  I will place him on an event monitor.  If no invasive workup is planned and he continues to have paroxysmal atrial fibrillation, he should be placed on Coumadin.  I will have him followup with his primary cardiologist, Dr. Diona Browner, after his event monitor is complete to review the findings and make further determination regarding Coumadin.  I discussed this with Dr. Clifton Cleofas (DOD) who agreed.

## 2011-01-01 NOTE — Consult Note (Signed)
NAME:  Jared Santos NO.:  [id number]  MEDICAL RECORD NO.:  [id number]  LOCATION:  3709                         FACILITY:  [facility]  PHYSICIAN:  Vesta Mixer, M.D. DATE OF BIRTH:  07/05/1933  DATE OF CONSULTATION: DATE OF DISCHARGE:                                CONSULTATION   Jared Santos is a 76 year old gentleman with a history of coronary artery disease and coronary artery bypass grafting.  He is admitted to the hospital with episodes of dyspnea and tachycardia.  The patient has not been seen in our office in quite sometime.  He used to see Dr. Eden Emms.  The patient reports several episodes of dyspnea. These episodes were associated with tachycardia.  They were not associated with any particular activity such as exercise, eating, drinking.  He does note that he seems to have more episodes when he is lying down, but that is not always the case.  These episodes are not at all similar to his previous episodes of angina before his coronary artery bypass grafting.  These episodes are described as an onset of shortness of breath.  He then notes that his heart rate is going fast.  He will eventually get better and feels quite well.  He denies any syncope or presyncope.  He denies any specific PND or orthopnea.  His current medications include: 1. Metformin 1000 mg twice a day. 2. Glipizide 10 mg twice a day. 3. Aspirin 81 mg a day. 4. Simvastatin 20 mg a day. 5. Ramipril 5 mg each morning. 6. Carvedilol 3.125 mg twice a day.  The above list is his home     medications.  He has no known drug allergies.  PAST MEDICAL HISTORY: 1. History of coronary artery disease.  He is status post coronary     artery bypass grafting. 2. Hypertension. 3. Paroxysmal atrial fibrillation. 4. History of peripheral vascular disease - he has a known occluded     right internal carotid artery.  SOCIAL HISTORY:  The patient quit smoking in 1988.  FAMILY HISTORY:   Positive for diabetes mellitus and hypertension.  REVIEW OF SYSTEMS:  As noted in the HPI.  He has occasional episodes where he coughs up a dark yellowish sputum.  He denies any weight loss. He denies any fevers or chills.  He denies any syncope.  He denies any angina.  He has not had taken nitroglycerin.  All other systems were reviewed and are noted above.  PHYSICAL EXAMINATION:  GENERAL:  He is a very pleasant black gentleman. He is in no acute distress. VITAL SIGNS:  His heart rate is 77, his blood pressure is 155/85. HEENT:  No JVD.  He has poor carotid pulse on his right side.  His mucous membranes are moist.  His sclerae are nonicteric. NECK:  Supple. LUNGS:  Clear. HEART:  Regular rate, S1 and S2. ABDOMEN:  Good bowel sounds and is nontender. EXTREMITIES:  He has no clubbing, cyanosis, or edema. NEUROLOGIC:  Nonfocal.  Cardiac enzymes are negative x2 sets.  His basic metabolic profile reveals a sodium of 139, potassium is 4.0, his creatinine is 1.12.  His white blood cell count is 4.7,  his hemoglobin is 16.5 with a hematocrit of 46.1.  His hemoglobin last night was over 17.  His chest x-ray reveals clear lungs.  His CT angiogram reveals no evidence of a pulmonary embolus.  IMPRESSION AND PLAN: 1. Dyspnea.  There is an unclear etiology at this time.  His     echocardiogram in May 2011, revealed normal left ventricular     systolic function with an ejection fraction of 55%.  He had mild     aortic insufficiency.  I suspect that he will still have a normal     left ventricular systolic function.  We will repeat his echocardiogram today. 1. Paroxysmal atrial fibrillation.  The patient is currently in normal     sinus rhythm.  It will be interesting to see if his episodes of     shortness of breath correlate with rapid atrial fibrillation.  We     will continue to monitor him.  I would like to increase his     carvedilol to 6.25 mg twice a day.  It is unusual to see  this high of hematocrit in a patient who is a nonsmoker.  I wonder if he has sleep apnea.  This may contribute to his episodes of paroxysmal atrial fibrillation as well as his hypertension. We will get an overnight oximetry.  We will continue to follow along with you.     Vesta Mixer, M.D.     PJN/MEDQ  D:  12/25/2010  T:  12/26/2010  Job:  [id number]  cc:   Noralyn Pick. Eden Emms, MD, [facility]  Electronically Signed by Kristeen Miss M.D. on 01/01/2011 08:03:20 AM

## 2011-01-01 NOTE — Progress Notes (Signed)
History of Present Illness: Primary Cardiologist:  Dr. Simona Huh in [place] Jared Santos is a 76 y.o. male who presents for post hospital follow up.    He is followed by Dr. Diona Browner in the [place] clinic.  He has a history of CAD, status post MI in 2005, status post drug-eluting stent to the RCA in 2005 with subsequent CABG in 3/09.  EF was previously 31%.  This recovered to normal post bypass.  He is status post RFCA for atrial flutter in 2005.  He is status post cerebral artery clipping secondary to aneurysm.  Other history includes diabetes, hypertension, hyperlipidemia, COPD and carotid stenosis.  His RICA is known to be occluded.  He was admitted to [facility] 9/18-9/21.  He presented with palpitations and dyspnea.  The discharge summary indicates that he had a brief run of atrial fibrillation in the emergency room that only lasted seconds.  The notes indicated he remained in sinus rhythm throughout the remainder of his admission.  He was seen by Dr. Elease Hashimoto.  History of basal wall was adjusted.  There was concern for sleep apnea and outpatient sleep testing was recommended.  Chest CT was performed secondary to an elevated d-dimer.  This demonstrated no pulmonary embolism.  He did have emphysematous changes with pulmonary nodules versus nodular scarring in the anterior right upper lobe, worse since 2008.  Followup PET scan was recommended.  Echocardiogram was performed 9/21 and demonstrated normal LV function with grade 2 diastolic dysfunction.  He was discharged to home with plans for early followup.  Unfortunately, no appointment was available in the [place] clinic and he was placed on my schedule here in [place].  Pertinent labs: Hemoglobin 16.1, d-dimer 0.82, potassium 4.1, creatinine 1.35, ALT 18, A1c 8.4, BNP 2136, TSH 2.67, TC 136, TG 117, HDL 42, LDL 71.  He feels better since discharge from the hospital.  The patient denies any chest pain or shortness of breath.  He  describes class II symptoms.  He denies Orthopnea, PND or edema.  He denies syncope or near-syncope.  He has occasional palpitations that last a few seconds.  He's had some recent nausea especially with his medications.  He denies any vomiting.  He's also had some right knee pain.  His wife says he snores.  He admits to daytime hypersomnolence.  Past Medical History  Diagnosis Date  . Chronic diastolic heart failure   . CAD (coronary artery disease)     s/p MI in 2005, s/p DES to RCA in 2005;  s/p CABG in 3/09: L-LAD, S-Dx, S-OM, S-dRCA/PDA  . DM (diabetes mellitus)   . Atrial flutter     s/p RFCA 2005  . HTN (hypertension)   . Cerebral aneurysm     s/p clipping   . Hypercholesterolemia   . Palpitations   . Atrial fibrillation     history of  . Carotid stenosis     h/o occluded RICA  . COPD (chronic obstructive pulmonary disease)   . Ischemic cardiomyopathy     EF 31% prior to CABG;  echo 9/12:  mod LVH, EF 60-65%, Grade 2 diast dysfxn, mild AI, mod LAE, mild RAE  . Lung nodule     CT 9/12: nodule vs nodular scarring worse since 2008; PET scan recommended    Current Outpatient Prescriptions  Medication Sig Dispense Refill  . acetaminophen (TYLENOL) 500 MG tablet Take 500 mg by mouth every 4 (four) hours as needed.        Marland Kitchen  carvedilol (COREG) 6.25 MG tablet TAKE 1 AND 1/2 (HALF) TABLET TWICE DAILY  90 tablet  11  . glipiZIDE (GLUCOTROL) 10 MG tablet Take 10 mg by mouth 2 (two) times daily before a meal.        . hydrochlorothiazide (MICROZIDE) 12.5 MG capsule Take 12.5 mg by mouth daily.        . metFORMIN (GLUCOPHAGE) 1000 MG tablet Take 1,000 mg by mouth 2 (two) times daily with a meal.        . ramipril (ALTACE) 5 MG capsule Take 5 mg by mouth daily.        . ranitidine (ZANTAC) 150 MG capsule Take 150 mg by mouth every evening.        . simvastatin (ZOCOR) 20 MG tablet Take 20 mg by mouth at bedtime.        Marland Kitchen DISCONTD: carvedilol (COREG) 6.25 MG tablet Take 6.25 mg by mouth 2  (two) times daily with a meal.        . DISCONTD: carvedilol (COREG) 6.25 MG tablet TAKE 1 AND 1/2 (HALF) TABLET TWICE DAILY  90 tablet  11  . aspirin 325 MG EC tablet Take 1 tablet (325 mg total) by mouth daily.        Allergies: No Known Allergies  Social history:  Ex-smoker  ROS:  Please see the history of present illness.  All other systems reviewed and negative.   Vital Signs: BP 162/62  Ht 5\' 9"  (1.753 m)  Wt 193 lb (87.544 kg)  BMI 28.50 kg/m2 Repeat blood pressure by me 140/60  PHYSICAL EXAM: Well nourished, well developed, in no acute distress HEENT: normal Neck: no JVD Cardiac:  normal S1, S2; RRR; no murmur Lungs:  Decreased breath sounds bilaterally, no wheezing, rhonchi or rales Abd: soft, nontender, no hepatomegaly Ext: no edema Skin: warm and dry Neuro:  CNs 2-12 intact, no focal abnormalities noted Psych: Normal affect  EKG:  Sinus rhythm, heart rate 85, rightward axis, anteroseptal Q waves, right bundle branch block, no significant change when compared to prior tracings  ASSESSMENT AND PLAN:

## 2011-01-01 NOTE — Assessment & Plan Note (Signed)
Refer to pulmonology for evaluation of possible sleep apnea.

## 2011-01-01 NOTE — Assessment & Plan Note (Signed)
No angina. Continue medical therapy.  

## 2011-01-01 NOTE — Assessment & Plan Note (Signed)
He presented with shortness of breath.  His BNP was significantly elevated.  He has significant diastolic dysfunction on echocardiogram.  I am unable to glean from the records whether or not he was diuresed.  His volume is stable today.  I will check a followup basic metabolic panel and BNP.  Continue current dose of hydrochlorothiazide.

## 2011-01-01 NOTE — Assessment & Plan Note (Signed)
Recent LDL optimal.  Continue simvastatin.

## 2011-01-03 ENCOUNTER — Other Ambulatory Visit: Payer: Self-pay

## 2011-01-03 ENCOUNTER — Telehealth: Payer: Self-pay | Admitting: *Deleted

## 2011-01-03 DIAGNOSIS — I5032 Chronic diastolic (congestive) heart failure: Secondary | ICD-10-CM

## 2011-01-03 LAB — BASIC METABOLIC PANEL
Chloride: 103 mEq/L (ref 96–112)
Creatinine, Ser: 1.7 mg/dL — ABNORMAL HIGH (ref 0.4–1.5)
Potassium: 5 mEq/L (ref 3.5–5.1)
Sodium: 138 mEq/L (ref 135–145)

## 2011-01-03 LAB — BRAIN NATRIURETIC PEPTIDE: Pro B Natriuretic peptide (BNP): 99 pg/mL (ref 0.0–100.0)

## 2011-01-03 NOTE — Telephone Encounter (Signed)
CALLED THE [facility] OFFICE AND S/W LYNN, LPN TO PUT REPEAT BMET IN FOR PT SO THEY COULD FAX LAB ORDER TO [place] FOR LAB ON 01/06/11. Danielle Rankin

## 2011-01-05 NOTE — H&P (Signed)
NAME:  Jared Santos, Jared Santos NO.:  [id number]  MEDICAL RECORD NO.:  [id number]  LOCATION:                                 FACILITY:  PHYSICIAN:  Eduard Clos, MDDATE OF BIRTH:  07/05/1933  DATE OF ADMISSION: DATE OF DISCHARGE:                             HISTORY & PHYSICAL   PRIMARY CARE PHYSICIAN:  Annia Friendly. Loleta Chance, MD  PRIMARY CARDIOLOGIST:  Dr. Charlton Haws.  PRIMARY CARDIAC SURGEON:  Evelene Croon, MD  CHIEF COMPLAINT:  Shortness of breath and palpitation.  HISTORY OF PRESENTING ILLNESS:  This 77 year old male with known history of CAD status post CABG, hypertension, hyperlipidemia, diabetes mellitus type 2, history of cerebral aneurysm status post clipping, history of atrial flutter status post ablation presenting with complaints of brief runs of palpitations and shortness of breath at the same time over the last 24 hours, happening multiple times.  In the ER, the patient had a brief run of AFib which lasted a few seconds, presently is sinus rhythm. The patient's cardiac enzymes have been negative.  The patient has been admitted for further workup.  The patient states that over the last 24 hours he has been experiencing spells of shortness of breath and palpitations at the same time which last for a few seconds, happened multiple times.  Along with that, he had some discomfort in his neck.  He denies specifically any chest pain, nausea, vomiting, or diaphoresis.  He denies any dizziness, loss of consciousness, any focal deficit.  Denies any abdominal pain, dysuria, discharge, diarrhea.  Denies any fever or chills or phlegm.  PAST MEDICAL HISTORY: 1. History of CAD status post CABG, status post stenting. 2. History of hypertension. 3. History of atrial flutter status post ablation. 4. History of postoperative atrial fibrillation. 5. History of hypertension. 6. History of hyperlipidemia. 7. History of diabetes mellitus, type 2. 8. History of  peripheral vascular disease with history of occluded     right internal carotid artery. 9. History of cerebral aneurysm, was hemorrhagic status post clamping     about 40 years ago. 10.Remote history of tuberculosis. 11.Remote tobacco abuse, quit in 1988.  MEDICATIONS PRIOR TO ADMISSION:  To be verified include aspirin, Glucophage, Glucotrol.  He takes two medicines for blood pressure control which he does not recall the name and also one for hyperlipidemia.  ALLERGIES:  No known drug allergies.  FAMILY HISTORY:  Positive for mother having diabetes and also has family history of hypertension, coronary artery disease, diabetes in his sisters.  SOCIAL HISTORY:  The patient quit smoking in 1988, denies any alcohol or drug abuse.  He is married.  REVIEW OF SYSTEMS:  As per the history of presenting illness, nothing else significant.  PHYSICAL EXAMINATION:  GENERAL:  The patient was examined at bedside, not in acute distress. VITAL SIGNS:  Blood pressure 156/80, pulse 79 per minute, temperature 98.3, respirations 18 per minute, O2 sat is 99%. HEENT:  Anicteric.  No pallor.  No discharge from ears, eyes, nose, or mouth. CHEST:  Bilateral air entry present.  No rhonchi, no crepitation. HEART:  S1 and S2 heard. ABDOMEN:  Soft, nontender.  Bowel sounds heard. CENTRAL NERVOUS SYSTEM:  Alert, awake, oriented to time, place, and person, moves upper and lower extremities, 5/5. EXTREMITIES:  Peripheral pulses felt.  No edema.  LABORATORY DATA:  EKG shows sinus rhythm with a right bundle-branch block, comparable to old EKG, heart rate is around 94 beats per minute with nonspecific ST-T changes. Chest x-ray shows cardiomegaly without evidence of acute cardiopulmonary disease. CBC:  WBC is 4.7, hemoglobin 17.3, hematocrit is 51, platelets 122.  D- dimer is 0.82.  Basic metabolic panel:  Sodium 142, potassium 3.8, chloride 105, carbon dioxide 23, glucose 120, BUN 20, creatinine  1.2, troponin 0.06.  BNP 2136.  ASSESSMENT: 1. Palpitations with shortness of breath with brief runs of atrial     fibrillation in the hospital. 2. History of atrial flutter status post ablation. 3. History of postoperative atrial fibrillation after coronary artery     bypass graft. 4. History of coronary artery disease status post coronary artery     bypass graft. 5. History of peripheral vascular disease with occluded right internal     carotid artery. 6. History of cerebral aneurysm status post clipping more than 40     years ago. 7. History of hyperlipidemia.  PLAN: 1. At this time, I will admit the patient to telemetry. 2. For his shortness of breath and palpitation, at this time CT angio     is pending for ruling out PE.  We will be cycling cardiac markers.     The patient will be placed on aspirin.  We will get a 2-D echo.  At     this time, we are     also going to get a stat thyroid function test to rule out     hyperthyroidism and we need to verify his home medication. 3. Further recommendations will be based on test order and clinical     course.     Eduard Clos, MD     ANK/MEDQ  D:  12/25/2010  T:  12/25/2010  Job:  [id number]  cc:   Evelene Croon, M.D. Annia Friendly. Loleta Chance, MD Noralyn Pick. Eden Emms, MD, [facility]  Electronically Signed by Midge Minium MD on 01/05/2011 11:59:58 AM

## 2011-01-07 ENCOUNTER — Other Ambulatory Visit: Payer: Self-pay

## 2011-01-07 DIAGNOSIS — I10 Essential (primary) hypertension: Secondary | ICD-10-CM

## 2011-01-07 LAB — BASIC METABOLIC PANEL
CO2: 23 mEq/L (ref 19–32)
Calcium: 9.5 mg/dL (ref 8.4–10.5)
Sodium: 138 mEq/L (ref 135–145)

## 2011-01-16 ENCOUNTER — Other Ambulatory Visit: Payer: Self-pay

## 2011-01-16 DIAGNOSIS — I10 Essential (primary) hypertension: Secondary | ICD-10-CM

## 2011-01-16 LAB — BASIC METABOLIC PANEL
CO2: 24 mEq/L (ref 19–32)
Calcium: 9.3 mg/dL (ref 8.4–10.5)
Creat: 1.31 mg/dL (ref 0.50–1.35)
Sodium: 137 mEq/L (ref 135–145)

## 2011-01-16 NOTE — Discharge Summary (Signed)
NAME:  Jared Santos, Jared Santos NO.:  [id number]  MEDICAL RECORD NO.:  [id number]  LOCATION:  3709                         FACILITY:  [facility]  PHYSICIAN:  Altha Harm, MDDATE OF BIRTH:  07/05/1933  DATE OF ADMISSION:  12/24/2010 DATE OF DISCHARGE:  12/27/2010                              DISCHARGE SUMMARY   DISCHARGE DISPOSITION:  Home.  FINAL DISCHARGE DIAGNOSES: 1. Paroxysmal atrial fibrillation. 2. Hypertension. 3. Diabetes type 2 uncontrolled. 4. Hyperlipidemia. 5. History of coronary artery disease, status post coronary artery     bypass graft, status post stenting. 6. History of peripheral vascular disease with the occluded right     internal carotid artery. 7. History of cerebral aneurysm. 8. Remote tobacco use in 1988.  DISCHARGE MEDICATIONS: 1. Carvedilol 625 mg p.o. b.i.d. with meals. 2. Hydrochlorothiazide 12.5 mg p.o. daily. 3. Aspirin 81 mg p.o. daily. 4. Glipizide XL 10 mg p.o. b.i.d. 5. Metformin 1000 mg p.o. b.i.d. to be resumed on December 28, 2010. 6. Simvastatin 20 mg p.o. nightly. 7. Tylenol Extra Strength 500 mg 1 tab p.o. q.4 h. p.r.n. pain.  CONSULTANTS:  Vesta Mixer, MD, [facility].  PROCEDURES:  None.  DIAGNOSTIC STUDIES: 1. An 2-D echocardiogram without contrast which shows a cavity size     small with moderate left ventricular hypertrophy and normal     systolic ejection fraction in the range of 60-66%.  Doppler     parameters consistent with grade 2 diastolic dysfunction. 2. Portable chest x-ray which shows cardiomegaly without evidence of     acute cardiopulmonary disease. 3. CT angiogram of the chest which shows no pulmonary embolus.  His     right lower lobe scarring versus atelectasis and sensory lobar     emphysematous changes.  Nodule versus nodular scarring in the right     anterior upper lobe increased in size from 2008 and recommend     further evaluation with a PET CT as an outpatient.  PRIMARY  CARE PHYSICIAN:  Annia Friendly. Loleta Chance, MD  PRIMARY CARDIOLOGIST:  Jonelle Sidle, MD  PRIMARY CARDIAC SURGEON:  Evelene Croon, MD  CHIEF COMPLAINT:  Shortness of breath and palpitations.  HISTORY OF PRESENT ILLNESS:  Please refer to the H and P by Dr. Toniann Fail for details of the HPI.  However in short, this is a 76 year old gentleman with a history of coronary artery disease, status post CABG, hyperlipidemia, diabetes type 2, hypertension and a history of atrial fibrillation, status post ablation who presents with palpitations and shortness of breath.  HOSPITAL COURSE: 1. Palpitations and shortness of breath.  The patient presents with     palpitations and shortness of breath.  In the emergency room, the     patient had a brief run of atrial fibrillation which lasted only a     few seconds and then converted to sinus rhythm.  The patient has     been in sinus rhythm since hospitalization.  He was seen by     Cardiology who increased his Coreg from 3.125-3.25 mg p.o. b.i.d.     The patient has had some sensations, but has had no further atrial  fibrillation on the monitor.  I have spoken with Cardiology who had     reviewed the 2-D echocardiogram.  They feel at this time that there     is no further inpatient interventions needed and the patient should     follow up with Dr. Diona Browner in the office on early next week.  I     spoke with Dr. Gala Romney who will call the office and have them     call to make an appointment with the patient.  The patient had     cardiac enzymes which were found to be negative.  He was also ruled     out for a pulmonary embolus by a CT angiogram. 2. Hypertension.  The patient's blood pressure was well-controlled     during this hospitalization. 3. Diabetes type 2 uncontrolled.  The patient has uncontrolled     diabetes type 2 is as reflected in the hemoglobin A1c of 8.4.     During his hospitalization, his metformin was held due to the fact     that he  had a contrast study on the 19th.  The patient's metformin     can be restarted on 22nd.  However, the patient needs to see his     primary care physician, Dr. Mirna Mires for further titration of     his medications.  I do believe that the patient will likely require     insulin further on his therapy to obtain better control of his     diabetes.  Please note that given the fact that the patient has     coronary artery disease, the importance of his diabetic control     increases as this is an independent risk factor for cardiac events. 4. Nodule versus nodular scarring.  CT angiogram of the chest     demonstrated nodules were versus nodular scarring.  Recommendations     are that the patient should have a PET CT to further evaluate this.     I will defer to the patient's primary care physician, Dr. Mirna Mires to follow up with the PET CT as an outpatient.  CONDITIONS AT THE TIME OF DISCHARGE:  Stable.  PHYSICAL EXAMINATION:  VITAL SIGNS:  Temperature is 97.4, heart rate 70, blood pressure 128/67, respiratory rate 20 and O2 sats are 96% on room air. HEENT:  He is normocephalic, atraumatic.  Pupils are equally, round andreactive to light and accommodation.  Extraocular movements are intact. Oropharynx is moist and active without erythema or lesions are noted. NECK:  Trachea is midline.  No masses, no thyromegaly, no JVD and no carotid bruit. RESPIRATORY:  The patient has a normal respiratory effort.  He does have some static crackles at the left lower lobe which had been unchanged on a daily basis.  No wheezing or rhonchi noted. CARDIOVASCULAR:  He has got a normal S1 and S2.  No murmurs, rubs or gallops are noted.  PMI is nondisplaced.  No heaves, rubs, or thrills on palpation. ABDOMEN:  Obese, soft, nontender and nondistended.  No masses and no hepatosplenomegaly noted. EXTREMITIES:  No clubbing, cyanosis or edema. PSYCHIATRIC:  He is alert and oriented x3.  Good insight and  cognition. Good recent and remote recall. NEUROLOGICAL:  The patient has no focal neurological deficits.  Cranial nerves II through XII are grossly intact.  DIETARY RESTRICTIONS:  The patient should be on a diabetic heart-healthy diet.  FOLLOWUP:  He is to follow  up with his primary care physician Dr. Mirna Mires within 3-5 days particularly he needs to address his diabetic control.  In addition, the patient has nodule versus nodular scarring in the lung parenchyma and the patient needs to have a followup PET CT scan arranged by his primary care physician to follow up with [place].  The patient is also to follow up with Dr. Diona Browner and [facility] will call him for an appointment.  PERTINENT LABORATORY STUDIES:  The patient's hemoglobin A1c was 8.4 on this admission.  At the time of discharge, his sodium is 134, potassium 4.15, chloride 99, bicarb 27, BUN 24 and creatinine 1.35.  Follow up testing which should be done and the patient is to have a PET CT of the chest to further evaluate nodules versus nodular scarring.  Total time for this discharge process including face-to-face time approximately 34 minutes.     Altha Harm, MD     MAM/MEDQ  D:  12/27/2010  T:  12/28/2010  Job:  [id number]  cc:   Annia Friendly. Loleta Chance, MD Renaye Rakers, M.D.  Electronically Signed by Marthann Schiller MD on 01/16/2011 07:23:42 AM

## 2011-01-24 ENCOUNTER — Encounter: Payer: Self-pay | Admitting: Cardiology

## 2011-01-28 ENCOUNTER — Institutional Professional Consult (permissible substitution): Payer: Medicare Other | Admitting: Pulmonary Disease

## 2011-02-05 ENCOUNTER — Ambulatory Visit (INDEPENDENT_AMBULATORY_CARE_PROVIDER_SITE_OTHER): Payer: Medicare Other | Admitting: Cardiology

## 2011-02-05 ENCOUNTER — Encounter: Payer: Self-pay | Admitting: Cardiology

## 2011-02-05 VITALS — BP 173/76 | HR 69 | Resp 18 | Ht 71.0 in | Wt 194.0 lb

## 2011-02-05 DIAGNOSIS — I2510 Atherosclerotic heart disease of native coronary artery without angina pectoris: Secondary | ICD-10-CM

## 2011-02-05 DIAGNOSIS — I10 Essential (primary) hypertension: Secondary | ICD-10-CM

## 2011-02-05 DIAGNOSIS — I4891 Unspecified atrial fibrillation: Secondary | ICD-10-CM

## 2011-02-05 DIAGNOSIS — R0602 Shortness of breath: Secondary | ICD-10-CM

## 2011-02-05 DIAGNOSIS — J984 Other disorders of lung: Secondary | ICD-10-CM

## 2011-02-05 MED ORDER — RAMIPRIL 10 MG PO CAPS: 10.0000 mg | ORAL_CAPSULE | Freq: Every day | ORAL | Status: DC

## 2011-02-05 NOTE — Assessment & Plan Note (Signed)
Patient has pending consultation with Pulmonary medicine in November. He does have a smoking history, also possible history of tuberculosis in the past. His CT angiogram of the chest from September recommended possible PET imaging. Will defer to Pulmonary for best recommendations.

## 2011-02-05 NOTE — Assessment & Plan Note (Signed)
Plan to advance Altace to 10 mg daily, followup BMET in 2 weeks.

## 2011-02-05 NOTE — Assessment & Plan Note (Signed)
Brief episode noted during hospitalization in September as described. No report of recurrent palpitations. Cannot locate results of reported event recorder ordered by [name], and nursing is checking into this. In the meanwhile, no plan to change from aspirin to Coumadin, particularly with pending pulmonary evaluation for possible pulmonary nodule.

## 2011-02-05 NOTE — Progress Notes (Signed)
Clinical Summary [name] is a 76 y.o.male presenting for office followup. He is a former patient of Dr. Eden Emms, seen most recently in the office by [name] following hospitalization in September. He was noted to have a brief episode of atrial fibrillation that spontaneously converted to sinus rhythm, and did not recur during hospitalization. Followup echocardiogram demonstrated LVEF of 60-65%. He was noted to have a possible lung nodule versus scar by CT angiogram, and was referred by [name] to our Pulmonary division for further evaluation, as well as for possible obstructive sleep apnea. This visit is pending in November.  [name] also decided to place an event recorder to document any potential breakthrough arrhythmias that might be asymptomatic. He was not placed on Coumadin in light of possible need for further invasive evaluation per Pulmonary. Unfortunately, I am not able to locate the results of the event recorder as yet. I have asked nursing to check into this.  [name] denies any palpitations. He still complains of intermittent shortness of breath that seems to be mainly exertional. His cardiac history is outlined below. He has not undergone any interval ischemic testing.  Followup lab work from this month showed sodium 137, potassium 4.5, BUN 17, creatinine 1.3.  No Known Allergies  Medication list reviewed.  Past Medical History  Diagnosis Date  . Chronic diastolic heart failure     LVEF 60-65% 9/12  . Coronary atherosclerosis of native coronary artery     s/p MI in 2005, s/p DES to RCA in 2005;  s/p CABG in 3/09: L-LAD, S-Dx, S-OM, S-dRCA/PDA  . Type 2 diabetes mellitus   . Atrial flutter     s/p RFCA 2005  . Essential hypertension, benign   . Cerebral aneurysm     s/p clipping   . Mixed hyperlipidemia   . Atrial fibrillation   . Carotid stenosis     h/o occluded RICA  . COPD (chronic obstructive pulmonary disease)   . Lung nodule     CT 9/12: nodule vs  nodular scarring worse since 2008; PET scan recommended  . History of tuberculosis     Past Surgical History  Procedure Date  . Coronary artery bypass graft 2005    LIMA to LAD, SVG to diagonal, SVG to OM, SVG to RCA and PDA    Family History  Problem Relation Age of Onset  . Diabetes    . Hypertension      Social History [name] reports that he has quit smoking. His smoking use included Cigarettes. He has never used smokeless tobacco. [name] reports that he does not drink alcohol.  Review of Systems Mild lower extremity edema, improved. No cough or hemoptysis. No unusual weight loss. Reports compliance with his medications.  Physical Examination Filed Vitals:   02/05/11 1309  BP: 173/76  Pulse: 69  Resp: 18   Normally nourished appearing male in no acute distress. HEENT: Conjunctiva and lids normal, oropharynx with moist mucosa. Neck: Supple, no elevated JVP, no thyromegaly. Lungs: Clear breath sounds, nonlabored, no wheezing. Cardiac: Regular rate and rhythm with occasional ectopic beat, soft systolic murmur, no S3. Abdomen: Soft, nontender, bowel sounds present. Skin: Warm and dry. Musculoskeletal: No kyphosis. Extremities: Trace ankle edema, distal pulses one plus. Neuropsychiatric: Alert and oriented x3, moves all extremities equally.  ECG Sinus rhythm with PAC's, RBBB, LPFB, old anterior infarct pattern.    Problem List and Plan

## 2011-02-05 NOTE — Assessment & Plan Note (Signed)
Etiology not well defined. Seems to report this in paroxysmal pattern, also exertional in nature. In light of his history of CAD, further ischemic testing will be obtained. We will proceed with a Lexiscan Myoview on medical therapy.

## 2011-02-05 NOTE — Assessment & Plan Note (Signed)
Multivessel disease status post bypass grafting in 2009. Most recent LVEF 60-65%, representing significant improvement over time. Continue medical therapy, and followup ischemic evaluation as noted.

## 2011-02-05 NOTE — Patient Instructions (Signed)
Your physician has recommended you make the following change in your medication: increase Altace to 10 mg daily  Your physician recommends that you return for lab work in: 2 weeks  Your physician has requested that you have a lexiscan myoview. For further information please visit [email or web address] Please follow instruction sheet, as given.  Your physician recommends that you schedule a follow-up appointment in: 1 month

## 2011-02-06 ENCOUNTER — Encounter: Payer: Self-pay | Admitting: Cardiology

## 2011-02-06 ENCOUNTER — Other Ambulatory Visit: Payer: Self-pay | Admitting: Cardiology

## 2011-02-06 DIAGNOSIS — R0602 Shortness of breath: Secondary | ICD-10-CM

## 2011-02-11 ENCOUNTER — Encounter (HOSPITAL_COMMUNITY)
Admission: RE | Admit: 2011-02-11 | Discharge: 2011-02-11 | Disposition: A | Discharge status: Home / Self Care | Payer: Medicare Other | Source: Ambulatory Visit | Attending: Cardiology | Admitting: Cardiology

## 2011-02-11 ENCOUNTER — Ambulatory Visit (INDEPENDENT_AMBULATORY_CARE_PROVIDER_SITE_OTHER): Payer: Medicare Other | Admitting: *Deleted

## 2011-02-11 ENCOUNTER — Encounter (HOSPITAL_COMMUNITY): Payer: Self-pay | Admitting: Cardiology

## 2011-02-11 ENCOUNTER — Encounter (HOSPITAL_COMMUNITY): Payer: Self-pay

## 2011-02-11 DIAGNOSIS — J449 Chronic obstructive pulmonary disease, unspecified: Secondary | ICD-10-CM | POA: Insufficient documentation

## 2011-02-11 DIAGNOSIS — R0602 Shortness of breath: Secondary | ICD-10-CM

## 2011-02-11 DIAGNOSIS — E119 Type 2 diabetes mellitus without complications: Secondary | ICD-10-CM | POA: Insufficient documentation

## 2011-02-11 DIAGNOSIS — I10 Essential (primary) hypertension: Secondary | ICD-10-CM | POA: Insufficient documentation

## 2011-02-11 DIAGNOSIS — I2510 Atherosclerotic heart disease of native coronary artery without angina pectoris: Secondary | ICD-10-CM

## 2011-02-11 DIAGNOSIS — R079 Chest pain, unspecified: Secondary | ICD-10-CM | POA: Insufficient documentation

## 2011-02-11 DIAGNOSIS — J4489 Other specified chronic obstructive pulmonary disease: Secondary | ICD-10-CM | POA: Insufficient documentation

## 2011-02-11 MED ORDER — TECHNETIUM TC 99M TETROFOSMIN IV KIT
30.0000 | PACK | Freq: Once | INTRAVENOUS | Status: AC | PRN
  Administered 2011-02-11: 31.2 via INTRAVENOUS

## 2011-02-11 MED ORDER — TECHNETIUM TC 99M TETROFOSMIN IV KIT
10.0000 | PACK | Freq: Once | INTRAVENOUS | Status: AC | PRN
  Administered 2011-02-11: 10.5 via INTRAVENOUS

## 2011-02-11 NOTE — Progress Notes (Signed)
Stress Lab Nurses Notes - [facility]  LADISLAV CASELLI 02/11/2011  Reason for doing test: CAD and Dyspnea  Type of test: Steffanie Dunn  Nurse performing test: Parke Poisson, RN  Nuclear Medicine Tech: Lou Cal  Echo Tech: Not Applicable  MD performing test: Ival Bible & Joni Reining, NP  Family MD: [facility]  Test explained and consent signed: yes  IV started: 22g jelco, Saline lock flushed, No redness or edema and Saline lock started in radiology  Symptoms: stomach discomfort  Treatment/Intervention: None  Reason test stopped: protocol completed  After recovery IV was: Discontinued via X-ray tech and No redness or edema  Patient to return to Nuc. Med at : 12:50  Patient discharged: Home  Patient's Condition upon discharge was: stable  Comments: During test peak BP 162/72 & HR 80.  Recovery BP 152/68 & HR 61.  Symptoms resolved in recovery.  Erskine Speed T

## 2011-02-19 ENCOUNTER — Encounter: Payer: Self-pay | Admitting: Pulmonary Disease

## 2011-02-19 ENCOUNTER — Ambulatory Visit (INDEPENDENT_AMBULATORY_CARE_PROVIDER_SITE_OTHER): Payer: Medicare Other | Admitting: Pulmonary Disease

## 2011-02-19 DIAGNOSIS — J439 Emphysema, unspecified: Secondary | ICD-10-CM | POA: Insufficient documentation

## 2011-02-19 DIAGNOSIS — R0683 Snoring: Secondary | ICD-10-CM

## 2011-02-19 DIAGNOSIS — J438 Other emphysema: Secondary | ICD-10-CM

## 2011-02-19 DIAGNOSIS — R0609 Other forms of dyspnea: Secondary | ICD-10-CM

## 2011-02-19 DIAGNOSIS — R0602 Shortness of breath: Secondary | ICD-10-CM

## 2011-02-19 DIAGNOSIS — R911 Solitary pulmonary nodule: Secondary | ICD-10-CM

## 2011-02-19 DIAGNOSIS — J984 Other disorders of lung: Secondary | ICD-10-CM

## 2011-02-19 MED ORDER — TIOTROPIUM BROMIDE MONOHYDRATE 18 MCG IN CAPS: 18.0000 ug | ORAL_CAPSULE | Freq: Every day | RESPIRATORY_TRACT | Status: DC

## 2011-02-19 MED ORDER — ALBUTEROL SULFATE HFA 108 (90 BASE) MCG/ACT IN AERS: 2.0000 | INHALATION_SPRAY | Freq: Four times a day (QID) | RESPIRATORY_TRACT | Status: DC | PRN

## 2011-02-19 NOTE — Assessment & Plan Note (Signed)
He has airflow obstruction on spirometry today.  Will arrange for full pulmonary function test to further assess.  Will give trial of spiriva and ventolin.

## 2011-02-19 NOTE — Assessment & Plan Note (Signed)
He has prior history of TB in 1971 for which he receive treatment.  He has right upper lobe nodule which has some increase in size since 2008.  He has history of smoking.  To further assess will arrange for PET scan

## 2011-02-19 NOTE — Patient Instructions (Signed)
Spiriva one puff daily Ventolin two puffs four times per day as needed for cough, wheeze, chest congestion, or shortness of breath Will schedule PET scan breathing test (PFT), and sleep study Follow up in 3 to 4 weeks

## 2011-02-19 NOTE — Progress Notes (Signed)
  Subjective:    Patient ID: Jared Santos, male    DOB: 07/05/1933, 76 y.o.   MRN: [id number]  HPI    Review of Systems  Constitutional: Negative.   HENT: Positive for ear pain, congestion, sneezing, trouble swallowing and dental problem.   Eyes: Negative.   Respiratory: Positive for shortness of breath.   Cardiovascular: Positive for leg swelling.  Gastrointestinal: Negative.        Heartburn indigestion  Genitourinary: Negative.   Musculoskeletal: Positive for arthralgias.  Skin: Negative.   Neurological: Negative.   Hematological: Negative.   Psychiatric/Behavioral: The patient is nervous/anxious.        Objective:   Physical Exam        Assessment & Plan:

## 2011-02-19 NOTE — Progress Notes (Signed)
Chief Complaint  Patient presents with  . Sleep consult    Epworth score is  5.    History of Present Illness:  Jared Santos is a 76 y.o. male former smoker for evaluation of dyspnea, pulmonary nodule, and sleep apnea.  He has been followed by cardiology for dyspnea.  He has a history of smoking and recent CT chest showed changes of emphysema as well as right upper lobe nodule.  He has a history of TB in 70.  He also has trouble with his sleep.  As a result pulmonary consultation was requested.  He has noticed a problem with his breathing for some time.  This has gotten worse since September.  He has a cough with chest congestion.  He has not had breathing tests or used inhalers before.  He was never told if he had COPD, emphysema, or asthma.  He does not have allergies.  He gets winded with exertion, and sometimes gets spells at rest.  He denies leg swelling, gland swelling, hemoptysis, or wheeze.  There is no history of leg/lung clots.  He denies any animal exposures.  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  He worked in a [employer] years ago, but for only a brief time.  He does snore.  His wife says he will stop breathing while asleep.  He will also wake up feeling like he has something in his throat.  He is not using anything to help sleep or stay awake.    The patient denies sleep walking, sleep talking, bruxism, or nightmares.  There is no history of restless legs.  The patient denies sleep hallucinations, sleep paralysis, or cataplexy.  Past Medical History  Diagnosis Date  . Chronic diastolic heart failure     LVEF 60-65% 9/12  . Coronary atherosclerosis of native coronary artery     s/p MI in 2005, s/p DES to RCA in 2005;  s/p CABG in 3/09: L-LAD, S-Dx, S-OM, S-dRCA/PDA  . Type 2 diabetes mellitus   . Atrial flutter     s/p RFCA 2005  . Essential hypertension, benign   . Cerebral aneurysm     s/p clipping   . Mixed  hyperlipidemia   . Atrial fibrillation   . Carotid stenosis     h/o occluded RICA  . COPD (chronic obstructive pulmonary disease)   . Lung nodule     CT 9/12: nodule vs nodular scarring worse since 2008; PET scan recommended  . History of tuberculosis     1971  . Diabetes mellitus     Past Surgical History  Procedure Date  . Coronary artery bypass graft 2005    LIMA to LAD, SVG to diagonal, SVG to OM, SVG to RCA and PDA  . Heart stent placement 2005    Current Outpatient Prescriptions on File Prior to Visit  Medication Sig Dispense Refill  . acetaminophen (TYLENOL) 500 MG tablet Take 500 mg by mouth every 4 (four) hours as needed.        Marland Kitchen aspirin 325 MG EC tablet Take 1 tablet (325 mg total) by mouth daily.      . carvedilol (COREG) 6.25 MG tablet TAKE 1 AND 1/2 (HALF) TABLET TWICE DAILY  90 tablet  11  . glipiZIDE (GLUCOTROL) 10 MG tablet Take 10 mg by mouth 2 (two) times daily before a meal.        . hydrochlorothiazide (MICROZIDE) 12.5 MG capsule Take 12.5  mg by mouth daily.        . metFORMIN (GLUCOPHAGE) 1000 MG tablet Take 1,000 mg by mouth 2 (two) times daily with a meal.        . ramipril (ALTACE) 10 MG capsule Take 1 capsule (10 mg total) by mouth daily. Dose increase  30 capsule  3  . ranitidine (ZANTAC) 150 MG capsule Take 150 mg by mouth every evening.        . simvastatin (ZOCOR) 20 MG tablet Take 20 mg by mouth at bedtime.          No Known Allergies  family history includes Arthritis in his mother; Diabetes in his mother; and Hypertension in an unspecified family member.   reports that he quit smoking about 24 years ago. His smoking use included Cigarettes. He has a 35 pack-year smoking history. He has never used smokeless tobacco. He reports that he does not drink alcohol or use illicit drugs.  ROS: Negative except above.  Blood pressure 104/72, pulse 72, temperature 98.1 F (36.7 C), temperature source Oral, height 5' 9.5" (1.765 m), weight 198 lb 9.6 oz  (90.084 kg), SpO2 94.00%.  Physical Exam:  General - Healthy HEENT - PERRLA, EOMI, no sinus tenderness, MP 4, wears dentures, no oral exudate, no LAN, no thyromegaly Cardiac - s1s2 regular, no murmur, pulses symmetric Chest - good air entry, no wheeze/rales/dullness Abdomen - soft, nontender, no organomegaly Extremities - no e/c/c Neurologic - normal strength, CN intact Skin - no rashes Psychiatric - normal mood, behavior  CT chest 12/24/10>>No PE.  RLL scar vs ATX, emphysema, 1.4 cm nodular scar ant RUL increase in size since 2008  Spirometry 02/19/11>>FEV1 1.48(53%), FEV1 49  Assessment/Plan:  DYSPNEA Likely multifactorial.  He has prior history of smoking.  He has evidence for COPD/emphysema.  Will give him trial of spiriva and as needed albuterol.  Will arrange for pulmonary function tests to further assess.  He has cardiovascular disease, and this is being addressed by cardiology.  Lung nodule He has prior history of TB in 1971 for which he receive treatment.  He has right upper lobe nodule which has some increase in size since 2008.  He has history of smoking.  To further assess will arrange for PET scan  Snoring He has snoring, witnessed apnea, sleep disruption, and daytime sleepiness.  He has history of cardiovascular disease.  I am concerned he could have sleep apnea.  I explained how sleep apnea can affect the patient's health.  Driving precautions and importance of weight loss were discussed.  Treatment options for sleep apnea were reviewed.  To further assess will arrange for sleep study.   COPD with emphysema He has airflow obstruction on spirometry today.  Will arrange for full pulmonary function test to further assess.  Will give trial of spiriva and ventolin.     Outpatient Encounter Prescriptions as of 02/19/2011  Medication Sig Dispense Refill  . acetaminophen (TYLENOL) 500 MG tablet Take 500 mg by mouth every 4 (four) hours as needed.        Marland Kitchen aspirin  325 MG EC tablet Take 1 tablet (325 mg total) by mouth daily.      . carvedilol (COREG) 6.25 MG tablet TAKE 1 AND 1/2 (HALF) TABLET TWICE DAILY  90 tablet  11  . glipiZIDE (GLUCOTROL) 10 MG tablet Take 10 mg by mouth 2 (two) times daily before a meal.        . hydrochlorothiazide (MICROZIDE) 12.5 MG capsule Take 12.5  mg by mouth daily.        . metFORMIN (GLUCOPHAGE) 1000 MG tablet Take 1,000 mg by mouth 2 (two) times daily with a meal.        . ramipril (ALTACE) 10 MG capsule Take 1 capsule (10 mg total) by mouth daily. Dose increase  30 capsule  3  . ranitidine (ZANTAC) 150 MG capsule Take 150 mg by mouth every evening.        . simvastatin (ZOCOR) 20 MG tablet Take 20 mg by mouth at bedtime.        Marland Kitchen albuterol (VENTOLIN HFA) 108 (90 BASE) MCG/ACT inhaler Inhale 2 puffs into the lungs every 6 (six) hours as needed for wheezing.  1 Inhaler  6  . tiotropium (SPIRIVA HANDIHALER) 18 MCG inhalation capsule Place 1 capsule (18 mcg total) into inhaler and inhale daily.  30 capsule  5    Dezaree Tracey Pager:  [phone] 02/19/2011, 3:32 PM

## 2011-02-19 NOTE — Assessment & Plan Note (Signed)
Likely multifactorial.  He has prior history of smoking.  He has evidence for COPD/emphysema.  Will give him trial of spiriva and as needed albuterol.  Will arrange for pulmonary function tests to further assess.  He has cardiovascular disease, and this is being addressed by cardiology.

## 2011-02-19 NOTE — Assessment & Plan Note (Signed)
He has snoring, witnessed apnea, sleep disruption, and daytime sleepiness.  He has history of cardiovascular disease.  I am concerned he could have sleep apnea.  I explained how sleep apnea can affect the patient's health.  Driving precautions and importance of weight loss were discussed.  Treatment options for sleep apnea were reviewed.  To further assess will arrange for sleep study.

## 2011-02-23 ENCOUNTER — Ambulatory Visit: Payer: Medicare Other | Attending: Pulmonary Disease

## 2011-02-23 DIAGNOSIS — R0683 Snoring: Secondary | ICD-10-CM

## 2011-02-23 DIAGNOSIS — G4733 Obstructive sleep apnea (adult) (pediatric): Secondary | ICD-10-CM | POA: Insufficient documentation

## 2011-03-05 ENCOUNTER — Encounter (HOSPITAL_COMMUNITY)
Admission: RE | Admit: 2011-03-05 | Discharge: 2011-03-05 | Disposition: A | Discharge status: Home / Self Care | Payer: Medicare Other | Source: Ambulatory Visit | Attending: Pulmonary Disease | Admitting: Pulmonary Disease

## 2011-03-05 ENCOUNTER — Encounter (HOSPITAL_COMMUNITY): Payer: Self-pay

## 2011-03-05 DIAGNOSIS — R911 Solitary pulmonary nodule: Secondary | ICD-10-CM

## 2011-03-05 DIAGNOSIS — J984 Other disorders of lung: Secondary | ICD-10-CM | POA: Insufficient documentation

## 2011-03-05 DIAGNOSIS — D1779 Benign lipomatous neoplasm of other sites: Secondary | ICD-10-CM | POA: Insufficient documentation

## 2011-03-05 DIAGNOSIS — Z87891 Personal history of nicotine dependence: Secondary | ICD-10-CM | POA: Insufficient documentation

## 2011-03-05 MED ORDER — FLUDEOXYGLUCOSE F - 18 (FDG) INJECTION
14.4000 | Freq: Once | INTRAVENOUS | Status: AC | PRN
  Administered 2011-03-05: 14.4 via INTRAVENOUS

## 2011-03-07 ENCOUNTER — Telehealth: Payer: Self-pay | Admitting: Pulmonary Disease

## 2011-03-07 ENCOUNTER — Encounter: Payer: Self-pay | Admitting: Pulmonary Disease

## 2011-03-07 DIAGNOSIS — G4733 Obstructive sleep apnea (adult) (pediatric): Secondary | ICD-10-CM

## 2011-03-07 NOTE — Procedures (Signed)
NAME:  Jared Santos, Jared Santos NO.:  [id number]  MEDICAL RECORD NO.:  [id number]          PATIENT TYPE:  OUT  LOCATION:  SLEEP CENTER                 FACILITY:  [facility]  PHYSICIAN:  Coralyn Helling, MD        DATE OF BIRTH:  07/05/1933  DATE OF STUDY:  02/23/2011                           NOCTURNAL POLYSOMNOGRAM  REFERRING PHYSICIAN:  Coralyn Helling, MD  REFERRING PHYSICIAN:  Dr. Eden Emms.  INDICATION FOR STUDY:  [name] is a 76 year old male who has a history of cardiovascular disease and COPD. He also has symptoms of snoring, sleep disruption, and daytime sleepiness.  He is therefore referred to sleep lab for evaluation of hypersomnia with obstructive sleep apnea.  Height is 5 feet 9 inches, weight is 198 pounds, BMI is 29.  EPWORTH SLEEPINESS SCORE:  13.  MEDICATIONS:  SLEEP ARCHITECTURE:  Total recording time was 502 minutes.  Total sleep time was 351 minute.  Sleep efficiency was 70%.  Sleep latency was 13 minutes.  REM latency was 34 minutes.  The patient was observed in all stages of sleep and slept predominantly in the non-supine position.  RESPIRATORY DATA:  The average rate was 22.  Moderate snoring was noted by the technician.  The overall apnea-hypopnea index was 0.3.  The respiratory disturbance index was 18.6.  The events were exclusively obstructive in nature.  OXYGEN DATA:  The baseline oxygenation was 86%.  The oxygen saturation nadir was 82%.  The patient spent a total of 168 minutes with an oxygen saturation below 88%.  The patient was started on 1 liter of oxygen and increased to 2 liters of oxygen to maintain his oxygen saturation greater than 89%.  CARDIAC DATA:  The average heart rate was 69 and the rhythm strip showed sinus rhythm with occasional PVCs.  MOVEMENT-PARASOMNIA:  The periodic limb movement index was 0 and the patient had 2 restroom trips.  IMPRESSIONS-RECOMMENDATIONS:  This study shows evidence for moderate obstructive sleep  apnea with a respiratory disturbance index of 18.6.  He did also have evidence for sleep-related hypoventilation with persistent nocturnal hypoxemia.  This had improved with the addition of 2 liters of supplemental oxygen.  In addition to continuing 2 liters of oxygen at night, I would recommend that the patient return to the sleep lab for a CPAP titration study.     Coralyn Helling, MD Diplomat, American Board of Sleep Medicine    VS/MEDQ  D:  03/07/2011 07:46:13  T:  03/07/2011 08:05:18  Job:  [id number]

## 2011-03-07 NOTE — Telephone Encounter (Signed)
PSG 02/23/11>>AHI 0.3, RDI 18.6, SpO2 low 82%.  Spent 168 min with SpO2 < 88%.  Used 2 liters oxygen during test.  Attempted to call pt with results.  He will need CPAP titration study.  Will have my nurse call to inform patient that he had moderate sleep apnea and he needs an additional sleep study to start therapy for his sleep apnea.  He can either get sleep test scheduled now, or wait until his appointment with me on December 6 to discuss further.

## 2011-03-10 ENCOUNTER — Encounter: Payer: Self-pay | Admitting: Cardiology

## 2011-03-10 ENCOUNTER — Ambulatory Visit (INDEPENDENT_AMBULATORY_CARE_PROVIDER_SITE_OTHER): Payer: Medicare Other | Admitting: Cardiology

## 2011-03-10 VITALS — BP 142/72 | HR 75 | Resp 20 | Ht 71.0 in | Wt 198.0 lb

## 2011-03-10 DIAGNOSIS — I5022 Chronic systolic (congestive) heart failure: Secondary | ICD-10-CM | POA: Insufficient documentation

## 2011-03-10 DIAGNOSIS — R0602 Shortness of breath: Secondary | ICD-10-CM

## 2011-03-10 DIAGNOSIS — J984 Other disorders of lung: Secondary | ICD-10-CM

## 2011-03-10 DIAGNOSIS — R911 Solitary pulmonary nodule: Secondary | ICD-10-CM

## 2011-03-10 DIAGNOSIS — I4891 Unspecified atrial fibrillation: Secondary | ICD-10-CM

## 2011-03-10 DIAGNOSIS — I255 Ischemic cardiomyopathy: Secondary | ICD-10-CM

## 2011-03-10 DIAGNOSIS — I10 Essential (primary) hypertension: Secondary | ICD-10-CM

## 2011-03-10 DIAGNOSIS — I2589 Other forms of chronic ischemic heart disease: Secondary | ICD-10-CM

## 2011-03-10 DIAGNOSIS — I2510 Atherosclerotic heart disease of native coronary artery without angina pectoris: Secondary | ICD-10-CM

## 2011-03-10 MED ORDER — ISOSORBIDE MONONITRATE ER 30 MG PO TB24: 30.0000 mg | ORAL_TABLET | Freq: Every day | ORAL | Status: DC

## 2011-03-10 MED ORDER — FUROSEMIDE 20 MG PO TABS: 20.0000 mg | ORAL_TABLET | Freq: Two times a day (BID) | ORAL | Status: DC

## 2011-03-10 NOTE — Progress Notes (Signed)
Clinical Summary Mr. Al is a 76 y.o.male presenting for followup. He was seen in October. Since then he has undergone evaluation by Dr. Craige Cotta for pulmonary nodule. He underwent pulmonary function testing as well as a sleep study, diagnosis of moderate obstructive sleep apnea. PET scan did not show any definite evidence of malignancy.  We had also referred him for a Myoview to assess for ischemia in light of his history of CAD and intermittent dyspnea on exertion. At this study was most consistent with prior anteroseptal infarct scar, LVEF 43%, and possible inferior scar. No large degree of ischemia was noted. We were also able locate his cardiac monitor which demonstrated sinus rhythm with IVCD, PACs and PVCs, but no definite atrial fibrillation.  He still reports dyspnea and has had orthopnea and PND as well. No chest pain or syncope. He is not yet on CPAP - has followup visit soon with Dr. Craige Cotta.   He reports no palpitations. Indicates compliance with his medications, although did not bring in the bottles, and we are not certain about his doses. He may still be taking HCTZ. Previously he did have some renal insufficiency following institution of ACE inhibitor and diuretic, although Altace was at 10 mg a day at that time.   No Known Allergies  Medication list reviewed.  Past Medical History  Diagnosis Date  . Chronic diastolic heart failure     LVEF 60-65% 9/12  . Coronary atherosclerosis of native coronary artery     s/p MI in 2005, s/p DES to RCA in 2005;  s/p CABG in 3/09: L-LAD, S-Dx, S-OM, S-dRCA/PDA  . Type 2 diabetes mellitus   . Atrial flutter     s/p RFCA 2005  . Essential hypertension, benign   . Cerebral aneurysm     s/p clipping   . Mixed hyperlipidemia   . Atrial fibrillation   . Carotid stenosis     h/o occluded RICA  . COPD (chronic obstructive pulmonary disease)   . Lung nodule     CT 9/12: nodule vs nodular scarring worse since 2008; PET scan recommended  .  History of tuberculosis     1971  . Diabetes mellitus   . OSA (obstructive sleep apnea) 01/01/2011    Past Surgical History  Procedure Date  . Coronary artery bypass graft 2005    LIMA to LAD, SVG to diagonal, SVG to OM, SVG to RCA and PDA  . Heart stent placement 2005    Family History  Problem Relation Age of Onset  . Diabetes Mother   . Hypertension    . Arthritis Mother     Social History Mr. Golob reports that he quit smoking about 24 years ago. His smoking use included Cigarettes. He has a 35 pack-year smoking history. He has never used smokeless tobacco. Mr. Wacker reports that he does not drink alcohol.  Review of Systems As outlined above, otherwise negative.  Physical Examination Filed Vitals:   03/10/11 0902  BP: 142/72  Pulse: 75  Resp:     Normally nourished appearing male in no acute distress.  HEENT: Conjunctiva and lids normal, oropharynx with moist mucosa.  Neck: Supple, no elevated JVP, no thyromegaly.  Lungs: Clear breath sounds, nonlabored, no wheezing.  Cardiac: Regular rate and rhythm with occasional ectopic beat, soft systolic murmur, no S3.  Abdomen: Soft, nontender, bowel sounds present.  Skin: Warm and dry.  Musculoskeletal: No kyphosis.  Extremities: Trace ankle edema, distal pulses one plus.  Neuropsychiatric: Alert and oriented x3,  moves all extremities equally.    Problem List and Plan

## 2011-03-10 NOTE — Progress Notes (Signed)
Medication bottles reviewed, to include new prescriptions and were correct.

## 2011-03-10 NOTE — Telephone Encounter (Signed)
I spoke with pt and he states he would like to wait until he see's VS at his OV. VS is aware of this

## 2011-03-10 NOTE — Assessment & Plan Note (Signed)
No clear evidence of malignancy based on recent PET scan. Followed by Dr. Craige Cotta.

## 2011-03-10 NOTE — Assessment & Plan Note (Signed)
Blood-pressure not optimally controlled, in fact quite high when he initially presented. Medication adjustments will be made.

## 2011-03-10 NOTE — Telephone Encounter (Signed)
Lm w/ family member to call back

## 2011-03-10 NOTE — Assessment & Plan Note (Signed)
Recent Myoview showed no frank ischemia, however evidence of anteroseptal and possibly inferior scar with LVEF down to 43%. I am concerned that at least some of his shortness of breath may still be cardiac in origin, although likely multifactorial in light of his documented pulmonary disease.

## 2011-03-10 NOTE — Assessment & Plan Note (Signed)
No evidence seen by followup cardiac monitor or clinically at this time. Continue to observe.

## 2011-03-10 NOTE — Patient Instructions (Addendum)
Your physician recommends that you schedule a follow-up appointment in: 1 week  Your physician has recommended you make the following change in your medication:  1 - STOP Hydrochlorathiazide if you are taking (Pt to return to office with medication bottles today) 2 - START Lasix (Furosemide) 20 mg daily 3 - Imdur 30 mg daily  Your physician recommends that you return for lab work in: Please get drawn day before follow up visit

## 2011-03-10 NOTE — Assessment & Plan Note (Signed)
Suspect this plays a part in his shortness of breath. Patient plans to review his medication bottles with nursing today. HCTZ will be discontinued, and we will institute Lasix 20 mg p.o. daily as well as Imdur 30 mg p.o. daily. No changes to baseline regimen. Office followup arranged in one week with BMET and BNP. If we are not able to improve his symptoms with medication adjustments and through Dr. Evlyn Courier assistance with pulmonary medications and CPAP, may need to consider left and right heart catheterization to get a better sense of his hemodynamics, and in fact how much his cardiac status is contributing. I have explained to Jared Santos that if he worsens acutely, he should likely be seen in the ER.

## 2011-03-10 NOTE — Assessment & Plan Note (Signed)
LVEF 43% by recent Myoview.

## 2011-03-11 ENCOUNTER — Encounter: Payer: Self-pay | Admitting: Cardiology

## 2011-03-11 ENCOUNTER — Telehealth: Payer: Self-pay | Admitting: Pulmonary Disease

## 2011-03-11 DIAGNOSIS — G4733 Obstructive sleep apnea (adult) (pediatric): Secondary | ICD-10-CM

## 2011-03-11 NOTE — Telephone Encounter (Signed)
Pt is aware order was sent

## 2011-03-11 NOTE — Telephone Encounter (Signed)
Please inform patient that I have sent order to arrange for in lab CPAP titration.

## 2011-03-11 NOTE — Telephone Encounter (Signed)
I spoke with pt and he states he would like to go ahead and get set up with his cpap titration study and have this done before he see's VS on 03/13/11. Please advise Dr. Craige Cotta, thanks

## 2011-03-12 DIAGNOSIS — G4733 Obstructive sleep apnea (adult) (pediatric): Secondary | ICD-10-CM

## 2011-03-12 DIAGNOSIS — I4949 Other premature depolarization: Secondary | ICD-10-CM

## 2011-03-12 DIAGNOSIS — I2510 Atherosclerotic heart disease of native coronary artery without angina pectoris: Secondary | ICD-10-CM

## 2011-03-12 DIAGNOSIS — J449 Chronic obstructive pulmonary disease, unspecified: Secondary | ICD-10-CM

## 2011-03-12 DIAGNOSIS — Z79899 Other long term (current) drug therapy: Secondary | ICD-10-CM

## 2011-03-13 ENCOUNTER — Ambulatory Visit (INDEPENDENT_AMBULATORY_CARE_PROVIDER_SITE_OTHER): Payer: Medicare Other | Admitting: Pulmonary Disease

## 2011-03-13 ENCOUNTER — Encounter: Payer: Self-pay | Admitting: Pulmonary Disease

## 2011-03-13 DIAGNOSIS — I2510 Atherosclerotic heart disease of native coronary artery without angina pectoris: Secondary | ICD-10-CM

## 2011-03-13 DIAGNOSIS — J984 Other disorders of lung: Secondary | ICD-10-CM

## 2011-03-13 DIAGNOSIS — R0602 Shortness of breath: Secondary | ICD-10-CM

## 2011-03-13 DIAGNOSIS — J439 Emphysema, unspecified: Secondary | ICD-10-CM

## 2011-03-13 DIAGNOSIS — G4733 Obstructive sleep apnea (adult) (pediatric): Secondary | ICD-10-CM

## 2011-03-13 DIAGNOSIS — J9611 Chronic respiratory failure with hypoxia: Secondary | ICD-10-CM | POA: Insufficient documentation

## 2011-03-13 DIAGNOSIS — J438 Other emphysema: Secondary | ICD-10-CM

## 2011-03-13 DIAGNOSIS — R911 Solitary pulmonary nodule: Secondary | ICD-10-CM

## 2011-03-13 DIAGNOSIS — R0902 Hypoxemia: Secondary | ICD-10-CM

## 2011-03-13 MED ORDER — ALBUTEROL SULFATE HFA 108 (90 BASE) MCG/ACT IN AERS: 2.0000 | INHALATION_SPRAY | Freq: Four times a day (QID) | RESPIRATORY_TRACT | Status: DC | PRN

## 2011-03-13 NOTE — Patient Instructions (Signed)
Will arrange for 2 liters oxygen with exertion Will arrange for breathing test (PFT) Will call with results of CPAP titration study Continue spiriva one puff daily Ventolin two puffs as needed for cough, wheeze, chest congestion, or shortness of breath Follow up in 6 to 8 weeks

## 2011-03-13 NOTE — Progress Notes (Signed)
Chief Complaint  Patient presents with  . Follow-up    Pt states he is very SOB, cough w/ thick white phlem, throat feels tight x monday. Pt is scheduled for cpap titration 03/23/11    History of Present Illness: Jared Santos is a 76 y.o. male former smoker with dyspnea, COPD/emphysema, lung nodule, and sleep apnea.  He is here to review his PET scan and sleep study.    PSG 02/23/11>>AHI 0.3, RDI 18.6, SpO2 low 82%.  Spent 168 min with SpO2 < 88%.  Used 2 liters oxygen during test.  He is schedule for CPAP titration on 03/23/11.  He has not had his PFT done yet.  He still uses spiriva, but ran out of ventolin.  He was started on imdur by cardiology, but has been getting a headache from this.  He tried cutting his pill in half to see if this would help.  Past Medical History  Diagnosis Date  . Chronic diastolic heart failure     LVEF 60-65% 9/12  . Coronary atherosclerosis of native coronary artery     s/p MI in 2005, s/p DES to RCA in 2005;  s/p CABG in 3/09: L-LAD, S-Dx, S-OM, S-dRCA/PDA  . Type 2 diabetes mellitus   . Atrial flutter     s/p RFCA 2005  . Essential hypertension, benign   . Cerebral aneurysm     s/p clipping   . Mixed hyperlipidemia   . Atrial fibrillation   . Carotid stenosis     h/o occluded RICA  . COPD (chronic obstructive pulmonary disease)   . Lung nodule     CT 9/12: nodule vs nodular scarring worse since 2008; PET scan recommended  . History of tuberculosis     1971  . Diabetes mellitus   . OSA (obstructive sleep apnea) 01/01/2011    Past Surgical History  Procedure Date  . Coronary artery bypass graft 2005    LIMA to LAD, SVG to diagonal, SVG to OM, SVG to RCA and PDA  . Heart stent placement 2005    Current Outpatient Prescriptions on File Prior to Visit  Medication Sig Dispense Refill  . acetaminophen (TYLENOL) 500 MG tablet Take 500 mg by mouth every 4 (four) hours as needed.        Marland Kitchen albuterol (VENTOLIN HFA) 108 (90 BASE) MCG/ACT  inhaler Inhale 2 puffs into the lungs every 6 (six) hours as needed for wheezing.  1 Inhaler  6  . aspirin 325 MG EC tablet Take 1 tablet (325 mg total) by mouth daily.      . carvedilol (COREG) 6.25 MG tablet TAKE 1 AND 1/2 (HALF) TABLET TWICE DAILY  90 tablet  11  . furosemide (LASIX) 20 MG tablet Take 1 tablet (20 mg total) by mouth 2 (two) times daily.  30 tablet  12  . glipiZIDE (GLUCOTROL) 10 MG tablet Take 10 mg by mouth 2 (two) times daily before a meal.        . isosorbide mononitrate (IMDUR) 30 MG 24 hr tablet Take 1 tablet (30 mg total) by mouth daily.  30 tablet  12  . metFORMIN (GLUCOPHAGE) 1000 MG tablet Take 1,000 mg by mouth 2 (two) times daily with a meal.        . ranitidine (ZANTAC) 150 MG capsule Take 150 mg by mouth every evening.        . simvastatin (ZOCOR) 20 MG tablet Take 20 mg by mouth at bedtime.        Marland Kitchen  tiotropium (SPIRIVA HANDIHALER) 18 MCG inhalation capsule Place 1 capsule (18 mcg total) into inhaler and inhale daily.  30 capsule  5    No Known Allergies  Physical Exam:  Blood pressure 148/78, pulse 75, temperature 98 F (36.7 C), temperature source Oral, height 5\' 11"  (1.803 m), weight 195 lb (88.451 kg), SpO2 91.00%. Body mass index is 27.20 kg/(m^2).  General - Healthy  HEENT - PERRLA, EOMI, no sinus tenderness, MP 4, wears dentures, no oral exudate, no LAN, no thyromegaly  Cardiac - s1s2 regular, no murmur, pulses symmetric  Chest - good air entry, no wheeze/rales/dullness  Abdomen - soft, nontender, no organomegaly  Extremities - no e/c/c  Neurologic - normal strength, CN intact  Skin - no rashes  Psychiatric - normal mood, behavior   Nm Pet Image Initial (pi) Skull Base To Thigh  03/05/2011  *RADIOLOGY REPORT*  Clinical Data:  Enlarging right upper lobe subpleural density. History of smoking.  INITIAL NUCLEAR MEDICINE FDG PET CT TUMOR IMAGING- (SKULL BASE THROUGH THIGHS)  Technique:  14.4 mCi F-18 FDG was injected intravenously via the right  antecubital fossa.  Full-ring PET imaging was performed from the skull base through the mid-thighs 60 minutes after injection. CT data was obtained and used for attenuation correction and anatomic localization only.  (This was not acquired as a diagnostic CT examination.)  Fasting Blood Glucose:  154.  Comparison: Chest CT 12/25/2010.  Findings: There is no hypermetabolic pulmonary activity. Specifically, the subpleural density anteriorly in the right upper lobe does not show any abnormal activity.  This density is demonstrated on images 72 and 73 and is stable to slightly improved compared with the recent CT examination.  There has been interval development of a 9 mm irregular density in the right upper lobe just superior to the minor fissure on the reformatted images.  This is located on axial image 93.  There is no abnormal metabolic activity in this area.  No hypermetabolic nodal activity is present within the neck, chest, abdomen or pelvis.  There is no abnormal metabolic activity within the liver, adrenal glands or bones.  CT images demonstrate stable simple lipomas involving the right subscapularis muscle and left lateral back.  There is a small left pleural effusion.  There is a small exophytic lesion projecting from the lower pole of the right kidney which does not demonstrate any abnormal metabolic activity.  A small umbilical hernia containing fat is noted.   IMPRESSION:  1.  The subpleural density in the right upper lobe does not show any abnormal activity and is most consistent with a focus of postinflammatory scarring. 2.  Interval development of subpleural density in the right upper lobe adjacent to the minor fissure.  This was not present on the CT performed approximately 10 weeks ago and does not show any abnormal metabolic activity.  Therefore, this is probably an inflammatory focus. Although neoplasm is unlikely, follow-up chest CT in 6 months should be considered to document involution. 3. No  other suspicious findings or evidence of malignancy.   Original Report Authenticated By: Gerrianne Scale, M.D.    Assessment/Plan:  DYSPNEA Likely multifactorial, related to COPD/emphysema, hypoxemia, and cardiovascular disease.    COPD with emphysema He feels spiriva has helped.  Will continue this and renew ventolin.  Will ensure he is schedule for PFT.  OSA (obstructive sleep apnea) He has moderate sleep apnea, and sleep related hypoventilation.    I have reviewed his sleep test results with the patient.  Explained how sleep apnea can affect the patient's health.  Driving precautions and importance of weight loss were discussed.  Treatment options for sleep apnea were reviewed.  He is scheduled for CPAP titration later this month.  Lung nodule The area of scarring on recent CT did not show significant uptake on PET scan, and is likely benign.  He did have development of a new RUL nodule.  Will need non-contrast CT chest for June 2013.  Hypoxemia He has oxygen desaturation on room air with exertion.  He will need 2 liters oxygen with exertion.  Will determine how much oxygen he needs at night after review of his CPAP titration.  CORONARY ATHEROSCLEROSIS, NATIVE VESSEL He has been getting headaches since he started imdur.  Advise him to d/w cardiology.     Outpatient Encounter Prescriptions as of 03/13/2011  Medication Sig Dispense Refill  . acetaminophen (TYLENOL) 500 MG tablet Take 500 mg by mouth every 4 (four) hours as needed.        Marland Kitchen albuterol (VENTOLIN HFA) 108 (90 BASE) MCG/ACT inhaler Inhale 2 puffs into the lungs every 6 (six) hours as needed for wheezing.  1 Inhaler  6  . aspirin 325 MG EC tablet Take 1 tablet (325 mg total) by mouth daily.      . carvedilol (COREG) 6.25 MG tablet TAKE 1 AND 1/2 (HALF) TABLET TWICE DAILY  90 tablet  11  . furosemide (LASIX) 20 MG tablet Take 1 tablet (20 mg total) by mouth 2 (two) times daily.  30 tablet  12  . glipiZIDE  (GLUCOTROL) 10 MG tablet Take 10 mg by mouth 2 (two) times daily before a meal.        . isosorbide mononitrate (IMDUR) 30 MG 24 hr tablet Take 1 tablet (30 mg total) by mouth daily.  30 tablet  12  . metFORMIN (GLUCOPHAGE) 1000 MG tablet Take 1,000 mg by mouth 2 (two) times daily with a meal.        . ramipril (ALTACE) 10 MG capsule Take 10 mg by mouth daily. ON HOLD       . ranitidine (ZANTAC) 150 MG capsule Take 150 mg by mouth every evening.        . simvastatin (ZOCOR) 20 MG tablet Take 20 mg by mouth at bedtime.        Marland Kitchen tiotropium (SPIRIVA HANDIHALER) 18 MCG inhalation capsule Place 1 capsule (18 mcg total) into inhaler and inhale daily.  30 capsule  5  . DISCONTD: ramipril (ALTACE) 5 MG tablet Take 5 mg by mouth daily.          Klea Nall Pager:  [phone] 03/13/2011, 4:36 PM

## 2011-03-13 NOTE — Assessment & Plan Note (Signed)
He has oxygen desaturation on room air with exertion.  He will need 2 liters oxygen with exertion.  Will determine how much oxygen he needs at night after review of his CPAP titration.

## 2011-03-13 NOTE — Assessment & Plan Note (Signed)
He has moderate sleep apnea, and sleep related hypoventilation.    I have reviewed his sleep test results with the patient.  Explained how sleep apnea can affect the patient's health.  Driving precautions and importance of weight loss were discussed.  Treatment options for sleep apnea were reviewed.  He is scheduled for CPAP titration later this month.

## 2011-03-13 NOTE — Assessment & Plan Note (Signed)
He feels spiriva has helped.  Will continue this and renew ventolin.  Will ensure he is schedule for PFT.

## 2011-03-13 NOTE — Assessment & Plan Note (Signed)
He has been getting headaches since he started imdur.  Advise him to d/w cardiology.

## 2011-03-13 NOTE — Assessment & Plan Note (Signed)
The area of scarring on recent CT did not show significant uptake on PET scan, and is likely benign.  He did have development of a new RUL nodule.  Will need non-contrast CT chest for June 2013.

## 2011-03-13 NOTE — Assessment & Plan Note (Signed)
Likely multifactorial, related to COPD/emphysema, hypoxemia, and cardiovascular disease.

## 2011-03-14 ENCOUNTER — Ambulatory Visit (INDEPENDENT_AMBULATORY_CARE_PROVIDER_SITE_OTHER): Payer: Medicare Other | Admitting: Pulmonary Disease

## 2011-03-14 DIAGNOSIS — J438 Other emphysema: Secondary | ICD-10-CM

## 2011-03-14 DIAGNOSIS — J439 Emphysema, unspecified: Secondary | ICD-10-CM

## 2011-03-14 LAB — PULMONARY FUNCTION TEST

## 2011-03-14 NOTE — Progress Notes (Signed)
PFT done today. 

## 2011-03-18 ENCOUNTER — Other Ambulatory Visit: Payer: Self-pay | Admitting: Cardiology

## 2011-03-18 ENCOUNTER — Ambulatory Visit (INDEPENDENT_AMBULATORY_CARE_PROVIDER_SITE_OTHER): Payer: Medicare Other | Admitting: Cardiology

## 2011-03-18 ENCOUNTER — Encounter: Payer: Self-pay | Admitting: Cardiology

## 2011-03-18 DIAGNOSIS — I2510 Atherosclerotic heart disease of native coronary artery without angina pectoris: Secondary | ICD-10-CM

## 2011-03-18 DIAGNOSIS — I5022 Chronic systolic (congestive) heart failure: Secondary | ICD-10-CM

## 2011-03-18 DIAGNOSIS — I10 Essential (primary) hypertension: Secondary | ICD-10-CM

## 2011-03-18 DIAGNOSIS — R0602 Shortness of breath: Secondary | ICD-10-CM

## 2011-03-18 LAB — BASIC METABOLIC PANEL
BUN: 23 mg/dL (ref 6–23)
Chloride: 103 mEq/L (ref 96–112)
Glucose, Bld: 182 mg/dL — ABNORMAL HIGH (ref 70–99)
Potassium: 4.5 mEq/L (ref 3.5–5.3)
Sodium: 138 mEq/L (ref 135–145)

## 2011-03-18 LAB — BRAIN NATRIURETIC PEPTIDE

## 2011-03-18 MED ORDER — CARVEDILOL 12.5 MG PO TABS: 12.5000 mg | ORAL_TABLET | Freq: Two times a day (BID) | ORAL | Status: DC

## 2011-03-18 NOTE — Progress Notes (Signed)
Clinical Summary Jared Santos is a 76 y.o.male presenting for followup. He was seen recently in December and has had followup with Dr. Craige Cotta as well - I reviewed his recommendations.  Lab work shows potassium 4.5, BUN 23, creatinine 1.1. BNP was not sent.  He reports feeling somewhat better. No edema. Has been using oxygen at night, but not with exertion. Seems to be sleeping better.  No complaint of palpitations or orthopnea. Reports headache at first with Imdur, although this has resolved.    No Known Allergies  Medication list reviewed.  Past Medical History  Diagnosis Date  . Chronic diastolic heart failure     LVEF 60-65% 9/12  . Coronary atherosclerosis of native coronary artery     s/p MI in 2005, s/p DES to RCA in 2005;  s/p CABG in 3/09: L-LAD, S-Dx, S-OM, S-dRCA/PDA  . Type 2 diabetes mellitus   . Atrial flutter     s/p RFCA 2005  . Essential hypertension, benign   . Cerebral aneurysm     s/p clipping   . Mixed hyperlipidemia   . Atrial fibrillation   . Carotid stenosis     h/o occluded RICA  . COPD (chronic obstructive pulmonary disease)   . Lung nodule   . History of tuberculosis     1971  . Diabetes mellitus   . OSA (obstructive sleep apnea) 01/01/2011  . Hypoxemia 03/13/2011    Past Surgical History  Procedure Date  . Coronary artery bypass graft 2005    LIMA to LAD, SVG to diagonal, SVG to OM, SVG to RCA and PDA  . Heart stent placement 2005    Family History  Problem Relation Age of Onset  . Diabetes Mother   . Hypertension    . Arthritis Mother     Social History [name] reports that he quit smoking about 24 years ago. His smoking use included Cigarettes. He has a 35 pack-year smoking history. He has never used smokeless tobacco. [name] reports that he does not drink alcohol.  Review of Systems Dry mouth. As outlined above, otherwise negative.  Physical Examination Filed Vitals:   03/18/11 1323  BP: 163/77  Pulse: 95   Normally  nourished appearing male in no acute distress.  HEENT: Conjunctiva and lids normal, oropharynx with moist mucosa.  Neck: Supple, no elevated JVP, no thyromegaly.  Lungs: Clear breath sounds, nonlabored, no wheezing.  Cardiac: Regular rate and rhythm with occasional ectopic beat, soft systolic murmur, no S3.  Abdomen: Soft, nontender, bowel sounds present.  Skin: Warm and dry.  Musculoskeletal: No kyphosis.  Extremities: Trace ankle edema, distal pulses one plus.  Neuropsychiatric: Alert and oriented x3, moves all extremities equally.    Problem List and Plan

## 2011-03-18 NOTE — Assessment & Plan Note (Signed)
Continue medical therapy and observation. 

## 2011-03-18 NOTE — Assessment & Plan Note (Addendum)
For now plan to continue current cardiac regimen except to increase Coreg to 12.5 mg BID. Followup arranged.

## 2011-03-18 NOTE — Assessment & Plan Note (Signed)
Multifactorial, cardiac disease and COPD with documented hypoxia and OSA. Now on oxygen and states that he is feeling somewhat better. Continue medical therapy.

## 2011-03-18 NOTE — Assessment & Plan Note (Signed)
Coreg dose being advanced. Could consider Norvasc next if needed.

## 2011-03-18 NOTE — Patient Instructions (Signed)
Your physician recommends that you schedule a follow-up appointment in: 6 weeks  Your physician has recommended you make the following change in your medication: Increase Coreg to 12.5 mg twice daily

## 2011-03-19 ENCOUNTER — Encounter: Payer: Self-pay | Admitting: Pulmonary Disease

## 2011-03-23 ENCOUNTER — Ambulatory Visit (HOSPITAL_BASED_OUTPATIENT_CLINIC_OR_DEPARTMENT_OTHER): Payer: Medicare Other | Attending: Pulmonary Disease

## 2011-03-23 VITALS — Ht 71.0 in | Wt 196.0 lb

## 2011-03-23 DIAGNOSIS — Z9989 Dependence on other enabling machines and devices: Secondary | ICD-10-CM

## 2011-03-23 DIAGNOSIS — J4489 Other specified chronic obstructive pulmonary disease: Secondary | ICD-10-CM | POA: Insufficient documentation

## 2011-03-23 DIAGNOSIS — I2510 Atherosclerotic heart disease of native coronary artery without angina pectoris: Secondary | ICD-10-CM | POA: Insufficient documentation

## 2011-03-23 DIAGNOSIS — G4733 Obstructive sleep apnea (adult) (pediatric): Secondary | ICD-10-CM | POA: Insufficient documentation

## 2011-03-23 DIAGNOSIS — I4949 Other premature depolarization: Secondary | ICD-10-CM | POA: Insufficient documentation

## 2011-03-23 DIAGNOSIS — J449 Chronic obstructive pulmonary disease, unspecified: Secondary | ICD-10-CM | POA: Insufficient documentation

## 2011-03-23 DIAGNOSIS — Z79899 Other long term (current) drug therapy: Secondary | ICD-10-CM | POA: Insufficient documentation

## 2011-04-01 NOTE — Procedures (Signed)
NAME:  Jared Santos, Jared Santos NO.:  [id number]  MEDICAL RECORD NO.:  [id number]          PATIENT TYPE:  OUT  LOCATION:  SLEEP CENTER                 FACILITY:  [facility]  PHYSICIAN:  Coralyn Helling, MD        DATE OF BIRTH:  07/05/1933  DATE OF STUDY:  03/23/2011                           NOCTURNAL POLYSOMNOGRAM  REFERRING PHYSICIAN:  Coralyn Helling, MD  INDICATION FOR STUDY:  Ms. Newey is a 76 year old male who has a history of coronary artery disease, diastolic heart failure, atrial flutter, COPD and nocturnal hypoxemia. He had a sleep study on February 23, 2011, and was found to have moderate obstructive sleep apnea with a respiratory disturbance index of 18.6. He returns to the sleep lab for a CPAP titration study.  Height is 5 feet 9 inches, weight is 198 pounds, BMI is 29. Neck size is 16 inches.  EPWORTH SLEEPINESS SCORE:  MEDICATIONS:  Glipizide, isosorbide, metformin, ramipril, aspirin, Zantac, furosemide, acetaminophen, carvedilol and simvastatin.  SLEEP ARCHITECTURE:  Total recording time was 363 minutes. Total sleep time was 34 minutes.  Sleep efficiency was 9%. Sleep latency was 135 minutes.  The study was notable for the lack of stage 3 sleep and REM sleep. The patient slept predominantly in the supine position.  RESPIRATORY DATA:  The average respiratory rate was 20.  The patient was started on CPAP of 4-cm of water and increased to 11-cm of water.  He was then tried on BiPAP of 8/4 and increased to 14/9.  However, due to insufficient sleep time, he was not able to be adequately titrated.  Of note, he had difficulty with respiratory events leading to sleep disruption and arousals.  OXYGEN DATA:  The patient on oxygenation was 91%. The oxygen saturation nadir was 87%. The study was conducted without the use of supplemental oxygen.  CARDIAC DATA:  The average heart rate was 73 and the rhythm strip showed sinus rhythm with occasional  PVCs.  MOVEMENT-PARASOMNIA:  The periodic limb movement index was 0 and the patient had 1 restroom trip.  IMPRESSIONS-RECOMMENDATIONS:  This was a inadequate titration study due to insufficient sleep time.  Of note, is that the patient had difficulty with sleep initiation and sleep maintenance due to respiratory events.  What I would recommend is that the patient could either undergo a auto CPAP titration study as an outpatient or return to the sleep lab for a repeat and lab titration study possibly with the use of a sleep aid.     Coralyn Helling, MD Diplomat, American Board of Sleep Medicine    VS/MEDQ  D:  03/31/2011 09:58:45  T:  04/01/2011 00:38:03  Job:  [id number]

## 2011-04-02 ENCOUNTER — Encounter: Payer: Self-pay | Admitting: Pulmonary Disease

## 2011-04-03 ENCOUNTER — Telehealth: Payer: Self-pay | Admitting: Pulmonary Disease

## 2011-04-03 DIAGNOSIS — J449 Chronic obstructive pulmonary disease, unspecified: Secondary | ICD-10-CM

## 2011-04-03 DIAGNOSIS — G4733 Obstructive sleep apnea (adult) (pediatric): Secondary | ICD-10-CM

## 2011-04-03 DIAGNOSIS — Z79899 Other long term (current) drug therapy: Secondary | ICD-10-CM

## 2011-04-03 DIAGNOSIS — J4489 Other specified chronic obstructive pulmonary disease: Secondary | ICD-10-CM

## 2011-04-03 DIAGNOSIS — I2510 Atherosclerotic heart disease of native coronary artery without angina pectoris: Secondary | ICD-10-CM

## 2011-04-03 DIAGNOSIS — I4949 Other premature depolarization: Secondary | ICD-10-CM

## 2011-04-03 NOTE — Telephone Encounter (Signed)
Reviewed results of CPAP titration with pt from 03/23/11.  He did not have sufficient sleep time to allow for adequate titration.  He had frequent arousals related to apneic events.  Will proceed with auto CPAP set up.  Will arrange for ONO on room air with CPAP, and then determine if he needs to continue with oxygen at night also.

## 2011-04-15 ENCOUNTER — Telehealth: Payer: Self-pay | Admitting: Pulmonary Disease

## 2011-04-15 NOTE — Telephone Encounter (Signed)
lmomtcb x1 did pt ever receive cpap machine?

## 2011-04-16 NOTE — Telephone Encounter (Signed)
Spoke with pt. He states that DME was calling him to get CPAP set up, but he has not started yet b/c he wanted to be sure that this is what VS wanted. I advised that yes, we ordered the machine for him to help with his OSA and he should get started on this asap. Pt verbalized understanding and states nothing further needed.

## 2011-04-24 ENCOUNTER — Encounter: Payer: Self-pay | Admitting: Pulmonary Disease

## 2011-04-24 ENCOUNTER — Ambulatory Visit (INDEPENDENT_AMBULATORY_CARE_PROVIDER_SITE_OTHER): Payer: Medicare Other | Admitting: Pulmonary Disease

## 2011-04-24 VITALS — BP 138/62 | HR 73 | Temp 97.6°F | Ht 71.0 in | Wt 199.2 lb

## 2011-04-24 DIAGNOSIS — R911 Solitary pulmonary nodule: Secondary | ICD-10-CM

## 2011-04-24 DIAGNOSIS — J439 Emphysema, unspecified: Secondary | ICD-10-CM

## 2011-04-24 DIAGNOSIS — G4733 Obstructive sleep apnea (adult) (pediatric): Secondary | ICD-10-CM

## 2011-04-24 DIAGNOSIS — J438 Other emphysema: Secondary | ICD-10-CM

## 2011-04-24 DIAGNOSIS — R0902 Hypoxemia: Secondary | ICD-10-CM

## 2011-04-24 DIAGNOSIS — J984 Other disorders of lung: Secondary | ICD-10-CM

## 2011-04-24 MED ORDER — TIOTROPIUM BROMIDE MONOHYDRATE 18 MCG IN CAPS: 18.0000 ug | ORAL_CAPSULE | Freq: Every day | RESPIRATORY_TRACT | Status: DC

## 2011-04-24 NOTE — Assessment & Plan Note (Signed)
The area of scarring on recent CT did not show significant uptake on PET scan, and is likely benign.  He did have development of a new RUL nodule.  Will need non-contrast CT chest for June 2013. 

## 2011-04-24 NOTE — Assessment & Plan Note (Signed)
He was confused about his CPAP set up.  Will try to re-arrange for CPAP set up.

## 2011-04-24 NOTE — Patient Instructions (Signed)
Spiriva one puff daily>>prescription sent to your pharmacy Ventolin two puffs as needed for cough, wheeze, or chest congestion Use 2 liters oxygen during the day and night Will arrange for CPAP machine to use while asleep Follow up in 3 months

## 2011-04-24 NOTE — Assessment & Plan Note (Signed)
Advised him about the proper use of his inhalers, and the role for spiriva and ventolin.  Will refill his spiriva.

## 2011-04-24 NOTE — Assessment & Plan Note (Signed)
He has oxygen desaturation on room air with exertion.  He will need 2 liters oxygen with exertion and sleep.  Will determine how much oxygen he needs at night after review of his CPAP titration.

## 2011-04-24 NOTE — Progress Notes (Signed)
Chief Complaint  Patient presents with  . Follow-up    Pt states his breathing has been doing good and is sleeping w/ his oxygen at night. Pt stopped the spiriva bc he did not know he had refills. Pt stated the ventolin makes his tongue hurt  . Sleep Apnea    Pt states he never received his cpap machine    History of Present Illness: Jared Santos is a 77 y.o. male former smoker with dyspnea, COPD/emphysema, lung nodule, and sleep apnea.  He did not get his CPAP machine.  He didn't understand that he needed both oxygen and CPAP.  He felt that spiriva helped his breathing.  He did not realize he had refills for this.  He has only been using his oxygen at night.  He has cough with clear to black sputum.  He does not have wheezing.  He was started on a water pill for his leg swelling, and this has helped.  Past Medical History  Diagnosis Date  . Chronic diastolic heart failure     LVEF 60-65% 9/12  . Coronary atherosclerosis of native coronary artery     s/p MI in 2005, s/p DES to RCA in 2005;  s/p CABG in 3/09: L-LAD, S-Dx, S-OM, S-dRCA/PDA  . Type 2 diabetes mellitus   . Atrial flutter     s/p RFCA 2005  . Essential hypertension, benign   . Cerebral aneurysm     s/p clipping   . Mixed hyperlipidemia   . Atrial fibrillation   . Carotid stenosis     h/o occluded RICA  . COPD (chronic obstructive pulmonary disease)   . Lung nodule   . History of tuberculosis     1971  . Diabetes mellitus   . OSA (obstructive sleep apnea) 01/01/2011  . Hypoxemia 03/13/2011    Past Surgical History  Procedure Date  . Coronary artery bypass graft 2005    LIMA to LAD, SVG to diagonal, SVG to OM, SVG to RCA and PDA  . Heart stent placement 2005    Current Outpatient Prescriptions on File Prior to Visit  Medication Sig Dispense Refill  . acetaminophen (TYLENOL) 500 MG tablet Take 500 mg by mouth every 4 (four) hours as needed.        Marland Kitchen albuterol (VENTOLIN HFA) 108 (90 BASE) MCG/ACT inhaler  Inhale 2 puffs into the lungs every 6 (six) hours as needed for wheezing.  1 Inhaler  6  . aspirin 325 MG EC tablet Take 1 tablet (325 mg total) by mouth daily.      . carvedilol (COREG) 12.5 MG tablet Take 1 tablet (12.5 mg total) by mouth 2 (two) times daily with a meal.  180 tablet  3  . furosemide (LASIX) 20 MG tablet Take 1 tablet (20 mg total) by mouth 2 (two) times daily.  30 tablet  12  . glipiZIDE (GLUCOTROL) 10 MG tablet Take 10 mg by mouth 2 (two) times daily before a meal.        . isosorbide mononitrate (IMDUR) 30 MG 24 hr tablet Take 1 tablet (30 mg total) by mouth daily.  30 tablet  12  . metFORMIN (GLUCOPHAGE) 1000 MG tablet Take 1,000 mg by mouth 2 (two) times daily with a meal.        . ramipril (ALTACE) 10 MG capsule Take 5 mg by mouth daily. ON HOLD      . ranitidine (ZANTAC) 150 MG capsule Take 150 mg by mouth every evening.        Marland Kitchen  simvastatin (ZOCOR) 20 MG tablet Take 20 mg by mouth at bedtime.        Marland Kitchen tiotropium (SPIRIVA HANDIHALER) 18 MCG inhalation capsule Place 1 capsule (18 mcg total) into inhaler and inhale daily.  30 capsule  5    No Known Allergies  Physical Exam:  Blood pressure 138/62, pulse 73, temperature 97.6 F (36.4 C), temperature source Oral, height 5\' 11"  (1.803 m), weight 199 lb 3.2 oz (90.357 kg), SpO2 90.00%. Body mass index is 27.78 kg/(m^2).  General - Healthy  HEENT - PERRLA, EOMI, no sinus tenderness, MP 4, wears dentures, no oral exudate, no LAN, no thyromegaly  Cardiac - s1s2 regular, no murmur, pulses symmetric  Chest - good air entry, no wheeze/rales/dullness  Abdomen - soft, nontender, no organomegaly  Extremities - no e/c/c  Neurologic - normal strength, CN intact  Skin - no rashes  Psychiatric - normal mood, behavior  Assessment/Plan:  Outpatient Encounter Prescriptions as of 04/24/2011  Medication Sig Dispense Refill  . acetaminophen (TYLENOL) 500 MG tablet Take 500 mg by mouth every 4 (four) hours as needed.        Marland Kitchen  albuterol (VENTOLIN HFA) 108 (90 BASE) MCG/ACT inhaler Inhale 2 puffs into the lungs every 6 (six) hours as needed for wheezing.  1 Inhaler  6  . aspirin 325 MG EC tablet Take 1 tablet (325 mg total) by mouth daily.      . carvedilol (COREG) 12.5 MG tablet Take 1 tablet (12.5 mg total) by mouth 2 (two) times daily with a meal.  180 tablet  3  . furosemide (LASIX) 20 MG tablet Take 1 tablet (20 mg total) by mouth 2 (two) times daily.  30 tablet  12  . glipiZIDE (GLUCOTROL) 10 MG tablet Take 10 mg by mouth 2 (two) times daily before a meal.        . isosorbide mononitrate (IMDUR) 30 MG 24 hr tablet Take 1 tablet (30 mg total) by mouth daily.  30 tablet  12  . metFORMIN (GLUCOPHAGE) 1000 MG tablet Take 1,000 mg by mouth 2 (two) times daily with a meal.        . ramipril (ALTACE) 10 MG capsule Take 5 mg by mouth daily. ON HOLD      . ranitidine (ZANTAC) 150 MG capsule Take 150 mg by mouth every evening.        . simvastatin (ZOCOR) 20 MG tablet Take 20 mg by mouth at bedtime.        Marland Kitchen tiotropium (SPIRIVA HANDIHALER) 18 MCG inhalation capsule Place 1 capsule (18 mcg total) into inhaler and inhale daily.  30 capsule  5    Pager:  [phone]

## 2011-04-29 ENCOUNTER — Ambulatory Visit (INDEPENDENT_AMBULATORY_CARE_PROVIDER_SITE_OTHER): Payer: Medicare Other | Admitting: Cardiology

## 2011-04-29 ENCOUNTER — Encounter: Payer: Self-pay | Admitting: Cardiology

## 2011-04-29 VITALS — BP 164/77 | HR 73 | Ht 71.0 in | Wt 197.0 lb

## 2011-04-29 DIAGNOSIS — I255 Ischemic cardiomyopathy: Secondary | ICD-10-CM

## 2011-04-29 DIAGNOSIS — J439 Emphysema, unspecified: Secondary | ICD-10-CM

## 2011-04-29 DIAGNOSIS — I10 Essential (primary) hypertension: Secondary | ICD-10-CM

## 2011-04-29 DIAGNOSIS — J438 Other emphysema: Secondary | ICD-10-CM

## 2011-04-29 DIAGNOSIS — I2589 Other forms of chronic ischemic heart disease: Secondary | ICD-10-CM

## 2011-04-29 DIAGNOSIS — I2510 Atherosclerotic heart disease of native coronary artery without angina pectoris: Secondary | ICD-10-CM

## 2011-04-29 MED ORDER — RAMIPRIL 10 MG PO CAPS: 10.0000 mg | ORAL_CAPSULE | Freq: Every day | ORAL | Status: DC

## 2011-04-29 NOTE — Patient Instructions (Signed)
Your physician recommends that you schedule a follow-up appointment in: 3 months  Your physician has recommended you make the following change in your medication: Increase Altace to 10 mg daily  Your physician recommends that you return for lab work in: 2 weeks (Feb 5th)

## 2011-04-29 NOTE — Assessment & Plan Note (Signed)
Blood pressure not optimal. Will increase Altace to 10 mg daily, check BMET in 2 weeks.

## 2011-04-29 NOTE — Assessment & Plan Note (Signed)
Continue followup with Dr. Sood. 

## 2011-04-29 NOTE — Progress Notes (Signed)
Clinical Summary Jared Santos is a 77 y.o.male presenting for followup. He was seen in December. I also reviewed Dr. Evlyn Courier recent note and plan.  Labwork from December showed potassium 4.5, BUN 25, creatinine 1.1, BNP 252. We have been managing him for CAD with LVEF 43% by Myoview showing scar but no frank ischemia as of 11/12.  He reports no angina, improved dyspnea on exertion, now using oxygen continuously. He will be set up with CPAP on Thursday.  We reviewed his medications. Weight has been relatively stable, ranging 5 pounds over the last several months. Leg edema is better.   No Known Allergies  Current Outpatient Prescriptions  Medication Sig Dispense Refill  . acetaminophen (TYLENOL) 500 MG tablet Take 500 mg by mouth every 4 (four) hours as needed.        Marland Kitchen albuterol (VENTOLIN HFA) 108 (90 BASE) MCG/ACT inhaler Inhale 2 puffs into the lungs every 6 (six) hours as needed for wheezing.  1 Inhaler  6  . aspirin 325 MG EC tablet Take 1 tablet (325 mg total) by mouth daily.      . carvedilol (COREG) 12.5 MG tablet Take 1 tablet (12.5 mg total) by mouth 2 (two) times daily with a meal.  180 tablet  3  . furosemide (LASIX) 20 MG tablet Take 1 tablet (20 mg total) by mouth 2 (two) times daily.  30 tablet  12  . glipiZIDE (GLUCOTROL) 10 MG tablet Take 10 mg by mouth 2 (two) times daily before a meal.        . isosorbide mononitrate (IMDUR) 30 MG 24 hr tablet Take 1 tablet (30 mg total) by mouth daily.  30 tablet  12  . metFORMIN (GLUCOPHAGE) 1000 MG tablet Take 1,000 mg by mouth 2 (two) times daily with a meal.        . ranitidine (ZANTAC) 150 MG capsule Take 150 mg by mouth every evening.        . simvastatin (ZOCOR) 20 MG tablet Take 20 mg by mouth at bedtime.        Marland Kitchen tiotropium (SPIRIVA HANDIHALER) 18 MCG inhalation capsule Place 1 capsule (18 mcg total) into inhaler and inhale daily.  30 capsule  5  . ramipril (ALTACE) 10 MG capsule Take 1 capsule (10 mg total) by mouth daily.  90  capsule  3    Past Medical History  Diagnosis Date  . Cardiomyopathy secondary     LVEF 60-65% 9/12, LVEF 43% by Myoview 11/12  . Coronary atherosclerosis of native coronary artery     s/p MI in 2005, s/p DES to RCA in 2005;  s/p CABG in 3/09: L-LAD, S-Dx, S-OM, S-dRCA/PDA  . Type 2 diabetes mellitus   . Atrial flutter     s/p RFCA 2005  . Essential hypertension, benign   . Cerebral aneurysm     s/p clipping   . Mixed hyperlipidemia   . Atrial fibrillation   . Carotid stenosis     h/o occluded RICA  . COPD (chronic obstructive pulmonary disease)   . Lung nodule   . History of tuberculosis     1971  . Diabetes mellitus   . OSA (obstructive sleep apnea) 01/01/2011  . Hypoxemia 03/13/2011    Social History Mr. Pelfrey reports that he quit smoking about 25 years ago. His smoking use included Cigarettes. He has a 35 pack-year smoking history. He has never used smokeless tobacco. Mr. Lindell reports that he does not drink alcohol.  Review of  Systems No cough or hemoptysis. No orthopnea. Negative except as outlined above.  Physical Examination Filed Vitals:   04/29/11 1007  BP: 164/77  Pulse: 73   Normally nourished appearing male, NAD, wearing oxygen. HEENT: Conjunctiva and lids normal, oropharynx with moist mucosa.  Neck: Supple, no elevated JVP, no thyromegaly.  Lungs: Diminished but clear breath sounds, nonlabored, no wheezing.  Cardiac: Regular rate and rhythm with occasional ectopic beat, soft systolic murmur, no S3.  Abdomen: Soft, nontender, bowel sounds present.  Skin: Warm and dry.  Musculoskeletal: No kyphosis.  Extremities: Trace ankle edema, distal pulses one plus.  Neuropsychiatric: Alert and oriented x3, moves all extremities equally.    Problem List and Plan

## 2011-04-29 NOTE — Assessment & Plan Note (Signed)
No active angina. Myoview 11/12 was without ischemia. Continue medical therapy and observation.

## 2011-04-29 NOTE — Assessment & Plan Note (Signed)
Continue medical therapy. Volume status stable. Altace dose being advanced.

## 2011-06-03 ENCOUNTER — Encounter: Payer: Self-pay | Admitting: Pulmonary Disease

## 2011-06-03 ENCOUNTER — Telehealth: Payer: Self-pay | Admitting: Pulmonary Disease

## 2011-06-03 DIAGNOSIS — G4733 Obstructive sleep apnea (adult) (pediatric): Secondary | ICD-10-CM

## 2011-06-03 NOTE — Telephone Encounter (Signed)
Pt returning call, please CB at [phone] [place]

## 2011-06-03 NOTE — Telephone Encounter (Signed)
LMTCB x1 for pt.  

## 2011-06-03 NOTE — Telephone Encounter (Signed)
CPAP 04/30/11 to 05/20/11>>Used on 21 of 22 nights with average 5 hrs 46 min.  Average AHI 2.1 with median CPAP 7 cm H2O and 95th percentile CPAP 9 cm H2O.  Will have my nurse inform pt that CPAP report looked good, and will set CPAP at 9 cm H2O.

## 2011-06-03 NOTE — Telephone Encounter (Signed)
I spoke with patient about results and he verbalized understanding and had no questions 

## 2011-06-11 ENCOUNTER — Telehealth: Payer: Self-pay | Admitting: Pulmonary Disease

## 2011-06-11 DIAGNOSIS — J449 Chronic obstructive pulmonary disease, unspecified: Secondary | ICD-10-CM

## 2011-06-11 DIAGNOSIS — R0902 Hypoxemia: Secondary | ICD-10-CM

## 2011-06-11 DIAGNOSIS — G4733 Obstructive sleep apnea (adult) (pediatric): Secondary | ICD-10-CM

## 2011-06-11 NOTE — Telephone Encounter (Signed)
ONO 05/21/11 with CPAP and RA>>Test time 10 hrs 14 min.  Mean SpO2 87.5%, low SpO2 74%.  Spent 5 hrs 37 min with SpO2 < 88%.  Will have my nurse inform patient that ONO showed trouble with his oxygen at night.  He needs to use 2 liters oxygen at night with CPAP while asleep.  I have sent order to his DME for this.  He needs to also continue using 2 liters oxygen with exertion during the day.

## 2011-06-18 NOTE — Telephone Encounter (Signed)
lmomtcb x1 

## 2011-06-19 NOTE — Telephone Encounter (Signed)
ATC line busy wcb 

## 2011-06-19 NOTE — Telephone Encounter (Signed)
lmomtcb x 2  

## 2011-06-20 ENCOUNTER — Encounter: Payer: Self-pay | Admitting: *Deleted

## 2011-06-20 ENCOUNTER — Encounter: Payer: Self-pay | Admitting: Pulmonary Disease

## 2011-06-20 NOTE — Telephone Encounter (Signed)
lmomtcb will send letter to pt house advising him to call the office since i have not been able to get in tough with pt. Will sign off message

## 2011-06-23 ENCOUNTER — Telehealth: Payer: Self-pay | Admitting: Pulmonary Disease

## 2011-06-23 NOTE — Telephone Encounter (Signed)
Mindy,please advise if you tried calling this patient-I looked and didn't see anything as far as labs,cxr results, or opened phone notes regarding this patient. Thanks.

## 2011-06-25 NOTE — Telephone Encounter (Signed)
lmomtcb x1 for pt. See 06/11/11 phone note

## 2011-06-26 NOTE — Telephone Encounter (Signed)
LMTCB x2  

## 2011-06-27 NOTE — Telephone Encounter (Signed)
I advised the pt of the following: SOOD,VINEET, MD 06/11/2011 1:13 PM Signed  ONO 05/21/11 with CPAP and RA>>Test time 10 hrs 14 min. Mean SpO2 87.5%, low SpO2 74%. Spent 5 hrs 37 min with SpO2 < 88%.  Will have my nurse inform patient that ONO showed trouble with his oxygen at night.  He needs to use 2 liters oxygen at night with CPAP while asleep. I have sent order to his DME for this. He needs to also continue using 2 liters oxygen with exertion during the day. Carron Curie, CMA

## 2011-07-02 ENCOUNTER — Telehealth: Payer: Self-pay | Admitting: Pulmonary Disease

## 2011-07-02 ENCOUNTER — Encounter: Payer: Self-pay | Admitting: Pulmonary Disease

## 2011-07-02 DIAGNOSIS — R0902 Hypoxemia: Secondary | ICD-10-CM

## 2011-07-02 DIAGNOSIS — G4733 Obstructive sleep apnea (adult) (pediatric): Secondary | ICD-10-CM

## 2011-07-02 NOTE — Telephone Encounter (Signed)
ONO 06/26/11 with CPAP and 2L oxygen>>Test time 10 hrs 54 min.  Mean SpO2 91.8%, low SpO2 77%.  Spent 1 hr 4 min with SpO2 < 88%.  Will have my nurse inform patient that oxygen level still running low (but improved) with CPAP and 2 liters oxygen at night.  He is to continue CPAP at night, but I have sent order for his oxygen to be increased to 3 liters at night with CPAP.  Will repeat ONO with CPAP and 3 liters oxygen.  He is to continue using 2 liters oxygen during the day.

## 2011-07-02 NOTE — Telephone Encounter (Signed)
I spoke with patient about results and he verbalized understanding and had no questions. Pt aware his dme will be calling regarding having the ono on 3 liters oxygen with cpap done and will call once results are available.

## 2011-07-16 ENCOUNTER — Telehealth: Payer: Self-pay | Admitting: Pulmonary Disease

## 2011-07-16 DIAGNOSIS — R0902 Hypoxemia: Secondary | ICD-10-CM

## 2011-07-16 NOTE — Telephone Encounter (Signed)
ONO with CPAP and 3 liters 07/08/11>>Test time 10 hrs 22 min.  Mean SpO2 92.9%, low SpO2 81%.  Spent 9 min 20 sec with SpO2 < 88%.  Will have my nurse inform patient that ONO looked good with CPAP and 3 liters oxygen.  He is to continue his current set up.

## 2011-07-16 NOTE — Telephone Encounter (Signed)
lmtcb x1 

## 2011-07-17 NOTE — Telephone Encounter (Signed)
I spoke with patient about results and he verbalized understanding and had no questions 

## 2011-07-21 ENCOUNTER — Encounter: Payer: Self-pay | Admitting: Pulmonary Disease

## 2011-07-21 ENCOUNTER — Ambulatory Visit (INDEPENDENT_AMBULATORY_CARE_PROVIDER_SITE_OTHER): Payer: Medicare Other | Admitting: Pulmonary Disease

## 2011-07-21 VITALS — BP 116/58 | HR 73 | Temp 97.6°F | Ht 71.0 in | Wt 183.0 lb

## 2011-07-21 DIAGNOSIS — R0902 Hypoxemia: Secondary | ICD-10-CM

## 2011-07-21 DIAGNOSIS — G4733 Obstructive sleep apnea (adult) (pediatric): Secondary | ICD-10-CM

## 2011-07-21 DIAGNOSIS — R911 Solitary pulmonary nodule: Secondary | ICD-10-CM

## 2011-07-21 DIAGNOSIS — J438 Other emphysema: Secondary | ICD-10-CM

## 2011-07-21 DIAGNOSIS — I5022 Chronic systolic (congestive) heart failure: Secondary | ICD-10-CM

## 2011-07-21 DIAGNOSIS — J439 Emphysema, unspecified: Secondary | ICD-10-CM

## 2011-07-21 NOTE — Assessment & Plan Note (Signed)
He is compliand and reports benefit from therapy.

## 2011-07-21 NOTE — Assessment & Plan Note (Signed)
He is to continue with 2 liters during day, and 3 liters at night with CPAP.

## 2011-07-21 NOTE — Assessment & Plan Note (Signed)
Advised him to d/w cardiology about any potential side effects he may be experiencing from ramipril.

## 2011-07-21 NOTE — Progress Notes (Signed)
Chief Complaint  Patient presents with  . COPD    Pt states his breathing is doing better. Some cough w/ occasional thick brown phlem. denies any wheezing, chest tx  . Sleep Apnea    Pt states he wears his cpap machine everynight x 6-8 hrs. Pt states he needs a new mask. Pt states he is sleeping better since using the cpap machine    History of Present Illness: Jared Santos is a 77 y.o. male former smoker with dyspnea, COPD/emphysema, lung nodule, and sleep apnea.  His breathing is doing better.  He still has occasional cough with clear to brown sputum.  He does not need to use albuterol much.  He is able to do more activity since he started using oxygen.  He is sleeping better.  He feels CPAP has helped his sleep and energy level.  He is not having any trouble with his mask.  He had his dose of ramipril increased recent.  He has been feeling funny since this change.  CPAP 04/30/11 to 05/20/11>>Used on 21 of 22 nights with average 5 hrs 46 min. Average AHI 2.1 with median CPAP 7 cm H2O and 95th percentile CPAP 9 cm H2O.  ONO with CPAP and 3 liters 07/08/11>>Test time 10 hrs 22 min. Mean SpO2 92.9%, low SpO2 81%. Spent 9 min 20 sec with SpO2 < 88%.  Past Medical History  Diagnosis Date  . Cardiomyopathy secondary     LVEF 60-65% 9/12, LVEF 43% by Myoview 11/12  . Coronary atherosclerosis of native coronary artery     s/p MI in 2005, s/p DES to RCA in 2005;  s/p CABG in 3/09: L-LAD, S-Dx, S-OM, S-dRCA/PDA  . Type 2 diabetes mellitus   . Atrial flutter     s/p RFCA 2005  . Essential hypertension, benign   . Cerebral aneurysm     s/p clipping   . Mixed hyperlipidemia   . Atrial fibrillation   . Carotid stenosis     h/o occluded RICA  . COPD (chronic obstructive pulmonary disease)   . Lung nodule   . History of tuberculosis     1971  . Diabetes mellitus   . OSA (obstructive sleep apnea) 01/01/2011  . Hypoxemia 03/13/2011    Past Surgical History  Procedure Date  . Coronary  artery bypass graft 2005    LIMA to LAD, SVG to diagonal, SVG to OM, SVG to RCA and PDA    Current Outpatient Prescriptions on File Prior to Visit  Medication Sig Dispense Refill  . acetaminophen (TYLENOL) 500 MG tablet Take 500 mg by mouth every 4 (four) hours as needed.        Marland Kitchen albuterol (VENTOLIN HFA) 108 (90 BASE) MCG/ACT inhaler Inhale 2 puffs into the lungs every 6 (six) hours as needed for wheezing.  1 Inhaler  6  . aspirin 325 MG EC tablet Take 1 tablet (325 mg total) by mouth daily.      . carvedilol (COREG) 12.5 MG tablet Take 1 tablet (12.5 mg total) by mouth 2 (two) times daily with a meal.  180 tablet  3  . furosemide (LASIX) 20 MG tablet Take 1 tablet (20 mg total) by mouth 2 (two) times daily.  30 tablet  12  . glipiZIDE (GLUCOTROL) 10 MG tablet Take 10 mg by mouth 2 (two) times daily before a meal.        . isosorbide mononitrate (IMDUR) 30 MG 24 hr tablet Take 1 tablet (30 mg total) by  mouth daily.  30 tablet  12  . metFORMIN (GLUCOPHAGE) 1000 MG tablet Take 1,000 mg by mouth 2 (two) times daily with a meal.        . ramipril (ALTACE) 10 MG capsule Take 1 capsule (10 mg total) by mouth daily.  90 capsule  3  . ranitidine (ZANTAC) 150 MG capsule Take 150 mg by mouth every evening.        . simvastatin (ZOCOR) 20 MG tablet Take 20 mg by mouth at bedtime.        Marland Kitchen tiotropium (SPIRIVA HANDIHALER) 18 MCG inhalation capsule Place 1 capsule (18 mcg total) into inhaler and inhale daily.  30 capsule  5    No Known Allergies  Physical Exam:  Blood pressure 116/58, pulse 73, temperature 97.6 F (36.4 C), temperature source Oral, height 5\' 11"  (1.803 m), weight 183 lb (83.008 kg), SpO2 91.00%. Body mass index is 25.52 kg/(m^2).  General - thin  HEENT - no sinus tenderness, MP 4, wears dentures, no oral exudate, no LAN  Cardiac - s1s2 regular, no murmur Chest - good air entry, no wheeze/rales/dullness  Abdomen - soft, nontender, no organomegaly  Extremities - no e/c/c    Neurologic - normal strength, CN intact  Skin - no rashes  Psychiatric - normal mood, behavior  Assessment/Plan:  Outpatient Encounter Prescriptions as of 07/21/2011  Medication Sig Dispense Refill  . acetaminophen (TYLENOL) 500 MG tablet Take 500 mg by mouth every 4 (four) hours as needed.        Marland Kitchen albuterol (VENTOLIN HFA) 108 (90 BASE) MCG/ACT inhaler Inhale 2 puffs into the lungs every 6 (six) hours as needed for wheezing.  1 Inhaler  6  . aspirin 325 MG EC tablet Take 1 tablet (325 mg total) by mouth daily.      . carvedilol (COREG) 12.5 MG tablet Take 1 tablet (12.5 mg total) by mouth 2 (two) times daily with a meal.  180 tablet  3  . furosemide (LASIX) 20 MG tablet Take 1 tablet (20 mg total) by mouth 2 (two) times daily.  30 tablet  12  . glipiZIDE (GLUCOTROL) 10 MG tablet Take 10 mg by mouth 2 (two) times daily before a meal.        . isosorbide mononitrate (IMDUR) 30 MG 24 hr tablet Take 1 tablet (30 mg total) by mouth daily.  30 tablet  12  . metFORMIN (GLUCOPHAGE) 1000 MG tablet Take 1,000 mg by mouth 2 (two) times daily with a meal.        . ramipril (ALTACE) 10 MG capsule Take 1 capsule (10 mg total) by mouth daily.  90 capsule  3  . ranitidine (ZANTAC) 150 MG capsule Take 150 mg by mouth every evening.        . simvastatin (ZOCOR) 20 MG tablet Take 20 mg by mouth at bedtime.        Marland Kitchen tiotropium (SPIRIVA HANDIHALER) 18 MCG inhalation capsule Place 1 capsule (18 mcg total) into inhaler and inhale daily.  30 capsule  5    Pager:  [phone]

## 2011-07-21 NOTE — Assessment & Plan Note (Signed)
He is doing well with Spiriva and prn albuterol.

## 2011-07-21 NOTE — Assessment & Plan Note (Signed)
He will need f/u CT chest w/o contrast in June 2013.

## 2011-07-21 NOTE — Patient Instructions (Signed)
Will schedule CT chest for June 2013 Follow up after CT chest in June 2013

## 2011-09-11 ENCOUNTER — Telehealth: Payer: Self-pay | Admitting: Cardiology

## 2011-09-11 NOTE — Telephone Encounter (Signed)
Pt states that he is taking simvastatin not lipitor, and he has refills.

## 2011-09-11 NOTE — Telephone Encounter (Signed)
Spoke With pt wife, pt will call back

## 2011-09-11 NOTE — Telephone Encounter (Signed)
PT NEEDS ATORVASTATIN 80MG  CALLED IN TO [facility], STATES THAT DR Jonny Ruiz JENKINS NORMALLY CALLS IT IN BUT THEY HAVE HAD A TIME WITH THEM TRYING TO GET IT CALLED IN SINCE HE IS ON VACATION.

## 2011-09-16 ENCOUNTER — Telehealth: Payer: Self-pay | Admitting: Pulmonary Disease

## 2011-09-16 ENCOUNTER — Ambulatory Visit (INDEPENDENT_AMBULATORY_CARE_PROVIDER_SITE_OTHER)
Admission: RE | Admit: 2011-09-16 | Discharge: 2011-09-16 | Disposition: A | Discharge status: Home / Self Care | Payer: Medicare Other | Source: Ambulatory Visit | Attending: Pulmonary Disease | Admitting: Pulmonary Disease

## 2011-09-16 DIAGNOSIS — R911 Solitary pulmonary nodule: Secondary | ICD-10-CM

## 2011-09-16 NOTE — Telephone Encounter (Signed)
Ct Chest Wo Contrast  09/16/2011  *RADIOLOGY REPORT*  Clinical Data: Former smoker.  Follow-up pulmonary nodule.  CT CHEST WITHOUT CONTRAST  Technique:  Multidetector CT imaging of the chest was performed following the standard protocol without IV contrast.  Comparison: PET CT Scan 03/05/2011  Findings: The right upper lobe nodule described on comparison exam has resolved in the interval consistent with an inflammatory or infectious etiology.  There are no new or suspicious pulmonary nodules.  Linear scarring at the right lung base is again demonstrated.  Central lobular emphysema is present in the upper lobes.  No axillary or supraclavicular lymphadenopathy.  No mediastinal or hilar lymphadenopathy.  Paratracheal lymph nodes are actually decreased in size compared to prior.  For example,  9 mm high right paratracheal lymph node decreased from 11 mm on prior.  No pericardial fluid.  Coronary calcifications are present.  Coronary bypass graft anatomy is noted.  Esophagus is normal.  Limited view of the upper abdomen is unremarkable.  IMPRESSION:  1.  Resolution of nodularity within the right upper lobe. 2.  No evidence of new nodularity. 3.  Central lobular emphysema.  Original Report Authenticated By: Genevive Bi, M.D.    Will have my nurse schedule ROV to review results.

## 2011-09-18 NOTE — Telephone Encounter (Signed)
Pt wife scheduled pt to come in 09/22/11 at 1:30

## 2011-09-22 ENCOUNTER — Ambulatory Visit (INDEPENDENT_AMBULATORY_CARE_PROVIDER_SITE_OTHER): Payer: Medicare Other | Admitting: Pulmonary Disease

## 2011-09-22 ENCOUNTER — Encounter: Payer: Self-pay | Admitting: Pulmonary Disease

## 2011-09-22 VITALS — BP 110/76 | HR 78 | Temp 98.0°F | Ht 71.0 in | Wt 195.8 lb

## 2011-09-22 DIAGNOSIS — J9610 Chronic respiratory failure, unspecified whether with hypoxia or hypercapnia: Secondary | ICD-10-CM

## 2011-09-22 DIAGNOSIS — J438 Other emphysema: Secondary | ICD-10-CM

## 2011-09-22 DIAGNOSIS — G4733 Obstructive sleep apnea (adult) (pediatric): Secondary | ICD-10-CM

## 2011-09-22 DIAGNOSIS — R0602 Shortness of breath: Secondary | ICD-10-CM

## 2011-09-22 DIAGNOSIS — J439 Emphysema, unspecified: Secondary | ICD-10-CM

## 2011-09-22 DIAGNOSIS — R911 Solitary pulmonary nodule: Secondary | ICD-10-CM

## 2011-09-22 DIAGNOSIS — J9611 Chronic respiratory failure with hypoxia: Secondary | ICD-10-CM

## 2011-09-22 DIAGNOSIS — R0902 Hypoxemia: Secondary | ICD-10-CM

## 2011-09-22 MED ORDER — ALBUTEROL SULFATE HFA 108 (90 BASE) MCG/ACT IN AERS: 2.0000 | INHALATION_SPRAY | Freq: Four times a day (QID) | RESPIRATORY_TRACT | Status: DC | PRN

## 2011-09-22 NOTE — Assessment & Plan Note (Signed)
Hopefully he will tolerate CPAP better after he gets his new mask.

## 2011-09-22 NOTE — Patient Instructions (Signed)
Follow up in 6 months 

## 2011-09-22 NOTE — Assessment & Plan Note (Signed)
He is to continue supplemental oxygen.               

## 2011-09-22 NOTE — Assessment & Plan Note (Signed)
Resolved on CT chest.  No further f/u is needed.

## 2011-09-22 NOTE — Assessment & Plan Note (Signed)
Improved since he has been using oxygen more regularly.

## 2011-09-22 NOTE — Assessment & Plan Note (Signed)
He has not noticed much benefit from spiriva.  Will have him stop this.  He is to continue as needed ventolin.

## 2011-09-22 NOTE — Progress Notes (Signed)
Chief Complaint  Patient presents with  . Follow-up    Pt is here to discuss CT results.     History of Present Illness: Jared Santos is a 77 y.o. male former smoker with dyspnea, COPD/emphysema, lung nodule, and sleep apnea.  He is here to review his CT chest.  His breathing is doing better.  He is using more regular oxygen.  He ran out of spiriva one month ago, and did not notice any difference.  He denies cough, wheeze, sputum, or chest congestion.   Past Medical History  Diagnosis Date  . Cardiomyopathy secondary     LVEF 60-65% 9/12, LVEF 43% by Myoview 11/12  . Coronary atherosclerosis of native coronary artery     s/p MI in 2005, s/p DES to RCA in 2005;  s/p CABG in 3/09: L-LAD, S-Dx, S-OM, S-dRCA/PDA  . Type 2 diabetes mellitus   . Atrial flutter     s/p RFCA 2005  . Essential hypertension, benign   . Cerebral aneurysm     s/p clipping   . Mixed hyperlipidemia   . Atrial fibrillation   . Carotid stenosis     h/o occluded RICA  . COPD (chronic obstructive pulmonary disease)   . Lung nodule   . History of tuberculosis     1971  . Diabetes mellitus   . OSA (obstructive sleep apnea) 01/01/2011  . Hypoxemia 03/13/2011    Past Surgical History  Procedure Date  . Coronary artery bypass graft 2005    LIMA to LAD, SVG to diagonal, SVG to OM, SVG to RCA and PDA    No Known Allergies  Physical Exam:  Blood pressure 110/76, pulse 78, temperature 98 F (36.7 C), temperature source Oral, height 5\' 11"  (1.803 m), weight 195 lb 12.8 oz (88.814 kg), SpO2 93.00%. Body mass index is 27.31 kg/(m^2).  General - thin  HEENT - no sinus tenderness, MP 4, wears dentures, no oral exudate, no LAN  Cardiac - s1s2 regular, no murmur Chest - good air entry, no wheeze/rales/dullness  Abdomen - soft, nontender, no organomegaly  Extremities - no e/c/c  Neurologic - normal strength, CN intact  Skin - no rashes  Psychiatric - normal mood, behavior  Ct Chest Wo  Contrast  09/16/2011  *RADIOLOGY REPORT*  Clinical Data: Former smoker.  Follow-up pulmonary nodule.  CT CHEST WITHOUT CONTRAST  Technique:  Multidetector CT imaging of the chest was performed following the standard protocol without IV contrast.  Comparison: PET CT Scan 03/05/2011  Findings: The right upper lobe nodule described on comparison exam has resolved in the interval consistent with an inflammatory or infectious etiology.  There are no new or suspicious pulmonary nodules.  Linear scarring at the right lung base is again demonstrated.  Central lobular emphysema is present in the upper lobes.  No axillary or supraclavicular lymphadenopathy.  No mediastinal or hilar lymphadenopathy.  Paratracheal lymph nodes are actually decreased in size compared to prior.  For example,  9 mm high right paratracheal lymph node decreased from 11 mm on prior.  No pericardial fluid.  Coronary calcifications are present.  Coronary bypass graft anatomy is noted.  Esophagus is normal.  Limited view of the upper abdomen is unremarkable.   IMPRESSION:  1.  Resolution of nodularity within the right upper lobe. 2.  No evidence of new nodularity. 3.  Central lobular emphysema.   Original Report Authenticated By: Genevive Bi, M.D.    Assessment/Plan:  Outpatient Encounter Prescriptions as of 09/22/2011  Medication  Sig Dispense Refill  . acetaminophen (TYLENOL) 500 MG tablet Take 500 mg by mouth every 4 (four) hours as needed.        Marland Kitchen aspirin 325 MG EC tablet Take 1 tablet (325 mg total) by mouth daily.      . carvedilol (COREG) 12.5 MG tablet Take 1 tablet (12.5 mg total) by mouth 2 (two) times daily with a meal.  180 tablet  3  . furosemide (LASIX) 20 MG tablet Take 1 tablet (20 mg total) by mouth 2 (two) times daily.  30 tablet  12  . glipiZIDE (GLUCOTROL) 10 MG tablet Take 10 mg by mouth 2 (two) times daily before a meal.        . isosorbide mononitrate (IMDUR) 30 MG 24 hr tablet Take 1 tablet (30 mg total) by mouth  daily.  30 tablet  12  . metFORMIN (GLUCOPHAGE) 1000 MG tablet Take 1,000 mg by mouth 2 (two) times daily with a meal.        . ramipril (ALTACE) 10 MG capsule Take 1 capsule (10 mg total) by mouth daily.  90 capsule  3  . ranitidine (ZANTAC) 150 MG capsule Take 150 mg by mouth every evening.        . simvastatin (ZOCOR) 20 MG tablet Take 20 mg by mouth at bedtime.        Marland Kitchen DISCONTD: tiotropium (SPIRIVA HANDIHALER) 18 MCG inhalation capsule Place 1 capsule (18 mcg total) into inhaler and inhale daily.  30 capsule  5  . albuterol (VENTOLIN HFA) 108 (90 BASE) MCG/ACT inhaler Inhale 2 puffs into the lungs every 6 (six) hours as needed for wheezing.  1 Inhaler  6  . DISCONTD: albuterol (VENTOLIN HFA) 108 (90 BASE) MCG/ACT inhaler Inhale 2 puffs into the lungs every 6 (six) hours as needed for wheezing.  1 Inhaler  6    Pager:  [phone]

## 2012-03-19 ENCOUNTER — Other Ambulatory Visit: Payer: Self-pay | Admitting: Pulmonary Disease

## 2012-03-19 NOTE — Telephone Encounter (Signed)
COPD with emphysema - SOOD,VINEET, MD 09/22/2011 1:45 PM Signed  He has not noticed much benefit from spiriva. Will have him stop this. He is to continue as needed ventolin pharamcy aware

## 2012-03-26 ENCOUNTER — Other Ambulatory Visit: Payer: Self-pay | Admitting: Cardiology

## 2012-03-26 MED ORDER — CARVEDILOL 12.5 MG PO TABS: 12.5000 mg | ORAL_TABLET | Freq: Two times a day (BID) | ORAL | Status: DC

## 2012-03-26 MED ORDER — ISOSORBIDE MONONITRATE ER 30 MG PO TB24: 30.0000 mg | ORAL_TABLET | Freq: Every day | ORAL | Status: DC

## 2012-03-30 ENCOUNTER — Other Ambulatory Visit: Payer: Self-pay | Admitting: Cardiology

## 2012-03-30 MED ORDER — FUROSEMIDE 20 MG PO TABS: 20.0000 mg | ORAL_TABLET | Freq: Two times a day (BID) | ORAL | Status: DC

## 2012-04-19 ENCOUNTER — Other Ambulatory Visit: Payer: Self-pay | Admitting: *Deleted

## 2012-04-19 MED ORDER — CARVEDILOL 12.5 MG PO TABS: 12.5000 mg | ORAL_TABLET | Freq: Two times a day (BID) | ORAL | Status: DC

## 2012-04-19 NOTE — Telephone Encounter (Signed)
Message left for patient to call and schedule and appointment, as he is past due.

## 2012-04-20 ENCOUNTER — Encounter: Payer: Self-pay | Admitting: Pulmonary Disease

## 2012-04-20 ENCOUNTER — Ambulatory Visit (INDEPENDENT_AMBULATORY_CARE_PROVIDER_SITE_OTHER): Payer: Medicare Other | Admitting: Pulmonary Disease

## 2012-04-20 VITALS — BP 118/62 | HR 91 | Temp 98.1°F | Ht 71.0 in | Wt 196.2 lb

## 2012-04-20 DIAGNOSIS — J9611 Chronic respiratory failure with hypoxia: Secondary | ICD-10-CM

## 2012-04-20 DIAGNOSIS — G4733 Obstructive sleep apnea (adult) (pediatric): Secondary | ICD-10-CM

## 2012-04-20 DIAGNOSIS — Z23 Encounter for immunization: Secondary | ICD-10-CM

## 2012-04-20 DIAGNOSIS — J439 Emphysema, unspecified: Secondary | ICD-10-CM

## 2012-04-20 DIAGNOSIS — R911 Solitary pulmonary nodule: Secondary | ICD-10-CM

## 2012-04-20 DIAGNOSIS — J438 Other emphysema: Secondary | ICD-10-CM

## 2012-04-20 DIAGNOSIS — J9610 Chronic respiratory failure, unspecified whether with hypoxia or hypercapnia: Secondary | ICD-10-CM

## 2012-04-20 DIAGNOSIS — R0902 Hypoxemia: Secondary | ICD-10-CM

## 2012-04-20 MED ORDER — TIOTROPIUM BROMIDE MONOHYDRATE 18 MCG IN CAPS: 18.0000 ug | ORAL_CAPSULE | Freq: Every day | RESPIRATORY_TRACT | Status: DC

## 2012-04-20 NOTE — Assessment & Plan Note (Signed)
He is intolerant of CPAP.

## 2012-04-20 NOTE — Assessment & Plan Note (Signed)
He is to use 3 liters oxygen 24/7.  Will arrange for ONO with 3 liters.

## 2012-04-20 NOTE — Assessment & Plan Note (Signed)
Will resume his spiriva.

## 2012-04-20 NOTE — Patient Instructions (Addendum)
Flu shot today Spiriva one puff daily Use 3 liters oxygen 24/7 Will arrange for overnight oxygen test and call with results Follow up in 6 months

## 2012-04-20 NOTE — Progress Notes (Signed)
Chief Complaint  Patient presents with  . Follow-up    c/o increase SOB w/ rest/exertion x 1 week. c/o occasional cough w/ globs of brown phlem. denies any wheezing, chest tx. pt states since being off the spiriva he can tell now it was helping with his breathing.     History of Present Illness: Jared Santos is a 78 y.o. male former smoker with dyspnea, COPD/emphysema, and sleep apnea.  He ran out of spiriva two months ago.  He was told by his pharmacy that he couldn't get anymore.  He is not sure what the reason for this was.  His breathing has been worse since being off spiriva.  He gets winded easily.  He does not have much cough, wheeze, or sputum.  He has not been able to tolerate CPAP.  His masks blows into his eyes.  He has not noticed any difference w/o using CPAP.  He is using 2 liters oxygen 24/7.   TESTS: CT chest 12/24/10>>No PE.  RLL scar vs ATX, emphysema, 1.4 cm nodular scar ant RUL increase in size since 2008 Spirometry 02/19/11>>FEV1 1.48(53%), FEV1 49 PSG 02/23/11>>AHI 0.3, RDI 18.6, SpO2 low 82%.  Spent 168 min with SpO2 < 88%.  Used 2 liters oxygen during test. PET scan 03/07/11 >> no activity RUL density 03/13/11 Ambulatory SpO2 on room air>>87%. PFT 03/14/11>>FEV1 2.42(84%), FEV1% 58, TLC 7.22(108%), DLCO 44%, no BD. CPAP 04/30/11 to 05/20/11>>Used on 21 of 22 nights with average 5 hrs 46 min.  Average AHI 2.1 with median CPAP 7 cm H2O and 95th percentile CPAP 9 cm H2O. ONO 05/21/11 with CPAP and RA>>Test time 10 hrs 14 min.  Mean SpO2 87.5%, low SpO2 74%.  Spent 5 hrs 37 min with SpO2 < 88%. ONO 06/26/11 with CPAP and 2L oxygen>>Test time 10 hrs 54 min.  Mean SpO2 91.8%, low SpO2 77%.  Spent 1 hr 4 min with SpO2 < 88%. ONO with CPAP and 3 liters 07/08/11>>Test time 10 hrs 22 min.  Mean SpO2 92.9%, low SpO2 81%.  Spent 9 min 20 sec with SpO2 < 88%. CT chest 07/21/11 >> RUL nodule resolved, centrilobular emphysema  Past Medical History  Diagnosis Date  .  Cardiomyopathy secondary     LVEF 60-65% 9/12, LVEF 43% by Myoview 11/12  . Coronary atherosclerosis of native coronary artery     s/p MI in 2005, s/p DES to RCA in 2005;  s/p CABG in 3/09: L-LAD, S-Dx, S-OM, S-dRCA/PDA  . Type 2 diabetes mellitus   . Atrial flutter     s/p RFCA 2005  . Essential hypertension, benign   . Cerebral aneurysm     s/p clipping   . Mixed hyperlipidemia   . Atrial fibrillation   . Carotid stenosis     h/o occluded RICA  . COPD (chronic obstructive pulmonary disease)   . Lung nodule   . History of tuberculosis     1971  . Diabetes mellitus   . OSA (obstructive sleep apnea) 01/01/2011  . Hypoxemia 03/13/2011    Past Surgical History  Procedure Date  . Coronary artery bypass graft 2005    LIMA to LAD, SVG to diagonal, SVG to OM, SVG to RCA and PDA    Outpatient Encounter Prescriptions as of 04/20/2012  Medication Sig Dispense Refill  . acetaminophen (TYLENOL) 500 MG tablet Take 500 mg by mouth every 4 (four) hours as needed.        Marland Kitchen albuterol (VENTOLIN HFA) 108 (90 BASE) MCG/ACT inhaler  Inhale 2 puffs into the lungs every 6 (six) hours as needed for wheezing.  1 Inhaler  6  . aspirin 325 MG EC tablet Take 1 tablet (325 mg total) by mouth daily.      . carvedilol (COREG) 12.5 MG tablet Take 1 tablet (12.5 mg total) by mouth 2 (two) times daily with a meal.  60 tablet  0  . furosemide (LASIX) 20 MG tablet Take 1 tablet (20 mg total) by mouth 2 (two) times daily.  60 tablet  0  . glipiZIDE (GLUCOTROL) 10 MG tablet Take 10 mg by mouth 2 (two) times daily before a meal.        . isosorbide mononitrate (IMDUR) 30 MG 24 hr tablet Take 1 tablet (30 mg total) by mouth daily.  30 tablet  1  . metFORMIN (GLUCOPHAGE) 1000 MG tablet Take 1,000 mg by mouth 2 (two) times daily with a meal.        . ramipril (ALTACE) 10 MG capsule Take 1 capsule (10 mg total) by mouth daily.  90 capsule  3  . ranitidine (ZANTAC) 150 MG capsule Take 150 mg by mouth every evening.          . simvastatin (ZOCOR) 20 MG tablet Take 20 mg by mouth at bedtime.          No Known Allergies  Physical Exam:  Filed Vitals:   04/20/12 0934 04/20/12 0935  BP: 118/62   Pulse: 89 91  Temp:  98.1 F (36.7 C)  TempSrc:  Oral  Height:  5\' 11"  (1.803 m)  Weight:  196 lb 3.2 oz (88.996 kg)  SpO2: 87% 91%     Current Encounter SPO2  04/20/12 0935 91%  04/20/12 0934 87%  09/22/11 1326 93%  07/21/11 0917 91%     Body mass index is 27.36 kg/(m^2).   Wt Readings from Last 2 Encounters:  04/20/12 196 lb 3.2 oz (88.996 kg)  09/22/11 195 lb 12.8 oz (88.814 kg)     General - No distress, wearing oxygen ENT - No sinus tenderness, no oral exudate, no LAN Cardiac - s1s2 regular, no murmur Chest - No wheeze/rales/dullness Back - No focal tenderness Abd - Soft, non-tender Ext - No edema Neuro - Normal strength Skin - No rashes Psych - normal mood, and behavior   Assessment/Plan:  Coralyn Helling, MD [facility] Pager:  [phone] 04/20/2012, 9:47 AM

## 2012-04-28 ENCOUNTER — Telehealth: Payer: Self-pay | Admitting: Cardiology

## 2012-04-28 MED ORDER — ISOSORBIDE MONONITRATE ER 30 MG PO TB24: 30.0000 mg | ORAL_TABLET | Freq: Every day | ORAL | Status: DC

## 2012-04-28 MED ORDER — FUROSEMIDE 20 MG PO TABS: 20.0000 mg | ORAL_TABLET | Freq: Two times a day (BID) | ORAL | Status: DC

## 2012-04-28 MED ORDER — CARVEDILOL 12.5 MG PO TABS: 12.5000 mg | ORAL_TABLET | Freq: Two times a day (BID) | ORAL | Status: DC

## 2012-04-28 NOTE — Telephone Encounter (Signed)
Patient needs medications refilled.  Has made appointment for 05/10/2012.  Needs refill of

## 2012-04-28 NOTE — Telephone Encounter (Signed)
Filled all three refills via escirbe to last pt until upcoming apt, placed notation in refill instructions that pt will need to keep upcoming apt for further refills

## 2012-04-28 NOTE — Telephone Encounter (Signed)
Needs Carvedilol 12.5 mg; Isosorbide 30 mg; furosemide 20 mg

## 2012-04-29 ENCOUNTER — Telehealth: Payer: Self-pay | Admitting: Pulmonary Disease

## 2012-04-29 DIAGNOSIS — J439 Emphysema, unspecified: Secondary | ICD-10-CM

## 2012-04-29 NOTE — Telephone Encounter (Signed)
ONO with 3 liters 04/22/12 >> Test time 11 hrs 33 min.  Mean SpO2 92%, low SpO2 76%.  Spent 46 min with SpO2 < 88%.  Results d/w pt.  Will increase oxygen to 4 liters at night and repeat ONO.

## 2012-05-05 ENCOUNTER — Telehealth: Payer: Self-pay | Admitting: Pulmonary Disease

## 2012-05-05 NOTE — Telephone Encounter (Signed)
ONO with 4 liters 05/04/12 >> Test time 11 hrs.  Mean SpO2 91%, low SpO2 74%.  Spent 1 hr 55 min with SpO2 < 88%.

## 2012-05-06 NOTE — Telephone Encounter (Signed)
Discussed ONO results with pt's wife.  Asked her to relay message to her husband, and have him call office to discuss plans to reconsider PAP therapy for OSA as this is likely contributing to his nocturnal oxygen desaturation, and supplemental oxygen alone will not likely solve this problem.

## 2012-05-10 ENCOUNTER — Ambulatory Visit (INDEPENDENT_AMBULATORY_CARE_PROVIDER_SITE_OTHER): Payer: Medicare Other | Admitting: Cardiology

## 2012-05-10 ENCOUNTER — Encounter: Payer: Self-pay | Admitting: Cardiology

## 2012-05-10 VITALS — BP 136/80 | HR 83 | Wt 201.0 lb

## 2012-05-10 DIAGNOSIS — J438 Other emphysema: Secondary | ICD-10-CM

## 2012-05-10 DIAGNOSIS — E785 Hyperlipidemia, unspecified: Secondary | ICD-10-CM

## 2012-05-10 DIAGNOSIS — J439 Emphysema, unspecified: Secondary | ICD-10-CM

## 2012-05-10 DIAGNOSIS — I429 Cardiomyopathy, unspecified: Secondary | ICD-10-CM

## 2012-05-10 DIAGNOSIS — I5022 Chronic systolic (congestive) heart failure: Secondary | ICD-10-CM

## 2012-05-10 DIAGNOSIS — I2510 Atherosclerotic heart disease of native coronary artery without angina pectoris: Secondary | ICD-10-CM

## 2012-05-10 DIAGNOSIS — I428 Other cardiomyopathies: Secondary | ICD-10-CM

## 2012-05-10 NOTE — Patient Instructions (Signed)
Your physician recommends that you schedule a follow-up appointment in: 6 months  Your physician has requested that you have an echocardiogram. Echocardiography is a painless test that uses sound waves to create images of your heart. It provides your doctor with information about the size and shape of your heart and how well your heart's chambers and valves are working. This procedure takes approximately one hour. There are no restrictions for this procedure.    

## 2012-05-10 NOTE — Progress Notes (Signed)
Clinical Summary Mr. Marcotte is a 78 y.o.male presenting for followup. He was last seen in January 2013.  He continues to follow with Dr. Craige Cotta with history of COPD and OSA.  On oxygen nearly continuously. Reports NYHA class 2-3 dyspnea which is stable, no angina on any regular basis, has not needed nitroglycerin.  Myoview study in November 2012 was abnormal, consistent with CAD, prior anteroseptal infarct as well as possible inferior scar, no significant ischemia, LVEF was 43% at that time. ECG today shows sinus rhythm with RBBB, LVH, right axis.  Recent lab work reviewed finding cholesterol 101, triglycerides 150, HDL 29, LDL 42, and normal LFTs.   No Known Allergies  Current Outpatient Prescriptions  Medication Sig Dispense Refill  . acetaminophen (TYLENOL) 500 MG tablet Take 500 mg by mouth every 4 (four) hours as needed.        Marland Kitchen albuterol (VENTOLIN HFA) 108 (90 BASE) MCG/ACT inhaler Inhale 2 puffs into the lungs every 6 (six) hours as needed for wheezing.  1 Inhaler  6  . aspirin 325 MG EC tablet Take 1 tablet (325 mg total) by mouth daily.      . carvedilol (COREG) 12.5 MG tablet Take 1 tablet (12.5 mg total) by mouth 2 (two) times daily with a meal.  60 tablet  0  . furosemide (LASIX) 20 MG tablet Take 1 tablet (20 mg total) by mouth 2 (two) times daily.  60 tablet  0  . glipiZIDE (GLUCOTROL) 10 MG tablet Take 10 mg by mouth 2 (two) times daily before a meal.        . isosorbide mononitrate (IMDUR) 30 MG 24 hr tablet Take 1 tablet (30 mg total) by mouth daily.  30 tablet  0  . metFORMIN (GLUCOPHAGE) 1000 MG tablet Take 1,000 mg by mouth 2 (two) times daily with a meal.        . ranitidine (ZANTAC) 150 MG capsule Take 150 mg by mouth every evening.        . simvastatin (ZOCOR) 20 MG tablet Take 20 mg by mouth at bedtime.        Marland Kitchen tiotropium (SPIRIVA HANDIHALER) 18 MCG inhalation capsule Place 1 capsule (18 mcg total) into inhaler and inhale daily.  30 capsule  12    Past Medical  History  Diagnosis Date  . Cardiomyopathy secondary     LVEF 60-65% 9/12, LVEF 43% by Myoview 11/12  . Coronary atherosclerosis of native coronary artery     s/p MI in 2005, s/p DES to RCA in 2005;  s/p CABG in 3/09: L-LAD, S-Dx, S-OM, S-dRCA/PDA  . Type 2 diabetes mellitus   . Atrial flutter     s/p RFCA 2005  . Essential hypertension, benign   . Cerebral aneurysm     s/p clipping   . Mixed hyperlipidemia   . Atrial fibrillation   . Carotid stenosis     h/o occluded RICA  . COPD (chronic obstructive pulmonary disease)   . Lung nodule   . History of tuberculosis     1971  . Diabetes mellitus   . OSA (obstructive sleep apnea) 01/01/2011  . Hypoxemia 03/13/2011    Social History Mr. Polio reports that he quit smoking about 26 years ago. His smoking use included Cigarettes. He has a 35 pack-year smoking history. He has never used smokeless tobacco. Mr. Hepp reports that he does not drink alcohol.  Review of Systems No palpitations or syncope. His weight has increased. He states he has  been eating in the early morning hours, very hungry at that time. Reports no leg edema on Lasix.  Physical Examination Filed Vitals:   05/10/12 0837  BP: 136/80  Pulse: 83   Filed Weights   05/10/12 0837  Weight: 201 lb (91.173 kg)    Normally nourished appearing male, NAD, wearing oxygen.  HEENT: Conjunctiva and lids normal, oropharynx with moist mucosa.  Neck: Supple, no elevated JVP, no thyromegaly.  Lungs: Diminished but clear breath sounds, nonlabored, no wheezing.  Cardiac: Regular rate and rhythm with occasional ectopic beat, soft systolic murmur, no S3.  Abdomen: Soft, nontender, bowel sounds present.  Skin: Warm and dry.  Musculoskeletal: No kyphosis.  Extremities: Trace ankle edema, distal pulses one plus.  Neuropsychiatric: Alert and oriented x3, moves all extremities equally.   Problem List and Plan   CORONARY ATHEROSCLEROSIS, NATIVE VESSEL Multivessel disease status  post CABG, symptomatically stable. Continue medical therapy, with recent Myoview in 2012 demonstrating scar without any large areas of ischemia. Followup arranged.  Chronic systolic heart failure Weight has increased, although he denies any leg edema, also states that he has been eating a snack in the early morning hours.   No quantification of LVEF since prior Myoview in 2012. An echocardiogram will be obtained to ensure stability.  HYPERLIPIDEMIA Recent LDL aggressively controlled on simvastatin. This is followed by Dr. Loleta Chance.  COPD with emphysema Followed by Dr. Craige Cotta, on oxygen.    Jonelle Sidle, M.D., F.A.C.C. Marland Kitchen

## 2012-05-10 NOTE — Assessment & Plan Note (Signed)
Recent LDL aggressively controlled on simvastatin. This is followed by Dr. Loleta Chance.

## 2012-05-10 NOTE — Assessment & Plan Note (Signed)
Followed by Dr. Craige Cotta, on oxygen.

## 2012-05-10 NOTE — Assessment & Plan Note (Signed)
Weight has increased, although he denies any leg edema, also states that he has been eating a snack in the early morning hours.   No quantification of LVEF since prior Myoview in 2012. An echocardiogram will be obtained to ensure stability.

## 2012-05-10 NOTE — Assessment & Plan Note (Signed)
Multivessel disease status post CABG, symptomatically stable. Continue medical therapy, with recent Myoview in 2012 demonstrating scar without any large areas of ischemia. Followup arranged.

## 2012-05-12 ENCOUNTER — Ambulatory Visit (HOSPITAL_COMMUNITY)
Admission: RE | Admit: 2012-05-12 | Discharge: 2012-05-12 | Disposition: A | Discharge status: Home / Self Care | Payer: Medicare Other | Source: Ambulatory Visit | Attending: Cardiology | Admitting: Cardiology

## 2012-05-12 DIAGNOSIS — I10 Essential (primary) hypertension: Secondary | ICD-10-CM | POA: Insufficient documentation

## 2012-05-12 DIAGNOSIS — I428 Other cardiomyopathies: Secondary | ICD-10-CM | POA: Insufficient documentation

## 2012-05-12 DIAGNOSIS — I2510 Atherosclerotic heart disease of native coronary artery without angina pectoris: Secondary | ICD-10-CM

## 2012-05-12 DIAGNOSIS — J4489 Other specified chronic obstructive pulmonary disease: Secondary | ICD-10-CM | POA: Insufficient documentation

## 2012-05-12 DIAGNOSIS — I429 Cardiomyopathy, unspecified: Secondary | ICD-10-CM

## 2012-05-12 DIAGNOSIS — E119 Type 2 diabetes mellitus without complications: Secondary | ICD-10-CM | POA: Insufficient documentation

## 2012-05-12 DIAGNOSIS — J449 Chronic obstructive pulmonary disease, unspecified: Secondary | ICD-10-CM | POA: Insufficient documentation

## 2012-05-12 NOTE — Progress Notes (Signed)
*  PRELIMINARY RESULTS* Echocardiogram 2D Echocardiogram has been performed.  Jared Santos 05/12/2012, 9:15 AM

## 2012-05-13 ENCOUNTER — Encounter: Payer: Self-pay | Admitting: Pulmonary Disease

## 2012-05-17 ENCOUNTER — Encounter: Payer: Self-pay | Admitting: *Deleted

## 2012-06-07 ENCOUNTER — Other Ambulatory Visit: Payer: Self-pay | Admitting: Cardiology

## 2012-06-07 ENCOUNTER — Encounter: Payer: Self-pay | Admitting: Pulmonary Disease

## 2012-06-07 MED ORDER — FUROSEMIDE 20 MG PO TABS: 20.0000 mg | ORAL_TABLET | Freq: Two times a day (BID) | ORAL | Status: DC

## 2012-06-10 ENCOUNTER — Telehealth: Payer: Self-pay | Admitting: Cardiology

## 2012-06-10 MED ORDER — CARVEDILOL 12.5 MG PO TABS: 12.5000 mg | ORAL_TABLET | Freq: Two times a day (BID) | ORAL | Status: DC

## 2012-06-10 NOTE — Telephone Encounter (Signed)
rx sent to pharmacy by e-script  

## 2012-06-10 NOTE — Telephone Encounter (Signed)
Pt needs carvedilol called in states that pharmacy requested this on Monday 06/07/12

## 2012-08-18 ENCOUNTER — Other Ambulatory Visit: Payer: Self-pay | Admitting: *Deleted

## 2012-08-18 MED ORDER — ISOSORBIDE MONONITRATE ER 30 MG PO TB24: 30.0000 mg | ORAL_TABLET | Freq: Every day | ORAL | Status: DC

## 2012-10-18 ENCOUNTER — Ambulatory Visit (INDEPENDENT_AMBULATORY_CARE_PROVIDER_SITE_OTHER): Payer: Medicare Other | Admitting: Pulmonary Disease

## 2012-10-18 ENCOUNTER — Encounter: Payer: Self-pay | Admitting: Pulmonary Disease

## 2012-10-18 VITALS — BP 130/60 | HR 88 | Temp 98.1°F | Ht 71.0 in | Wt 191.0 lb

## 2012-10-18 DIAGNOSIS — J9611 Chronic respiratory failure with hypoxia: Secondary | ICD-10-CM

## 2012-10-18 DIAGNOSIS — J439 Emphysema, unspecified: Secondary | ICD-10-CM

## 2012-10-18 DIAGNOSIS — J438 Other emphysema: Secondary | ICD-10-CM

## 2012-10-18 DIAGNOSIS — G4733 Obstructive sleep apnea (adult) (pediatric): Secondary | ICD-10-CM

## 2012-10-18 DIAGNOSIS — J9610 Chronic respiratory failure, unspecified whether with hypoxia or hypercapnia: Secondary | ICD-10-CM

## 2012-10-18 DIAGNOSIS — R0902 Hypoxemia: Secondary | ICD-10-CM

## 2012-10-18 MED ORDER — ALBUTEROL SULFATE HFA 108 (90 BASE) MCG/ACT IN AERS: 2.0000 | INHALATION_SPRAY | Freq: Four times a day (QID) | RESPIRATORY_TRACT | Status: DC | PRN

## 2012-10-18 NOTE — Patient Instructions (Signed)
Will schedule sleep study and call with results Ventolin two puffs as needed for cough, wheeze, or chest congestion Spiriva one puff daily Follow up in 3 months

## 2012-10-18 NOTE — Assessment & Plan Note (Signed)
Stable on spiriva.  Will renew ventolin >> discussed proper using, timing of ventolin.

## 2012-10-18 NOTE — Assessment & Plan Note (Signed)
He continues to have oxygen desaturation at night in spite of using 4 liters oxygen.  Explained this is likely related to OSA.  He was not able to tolerate CPAP before due to mask issues.  He still has sleep disruption.    To further assess will repeat in lab sleep study.

## 2012-10-18 NOTE — Progress Notes (Signed)
Chief Complaint  Patient presents with  . COPD    Breathing is slightly improved. Reports SOB, coughing with production of brown mucus, chest pain. Denies wheezing or chest tightness.    History of Present Illness: Jared Santos is a 78 y.o. male former smoker with dyspnea, COPD/emphysema, and sleep apnea.  His breathing is doing okay.  He sometimes gets more trouble with activity, and has trouble breathing when he bends over.  He is not having cough, wheeze, or sputum.  He denies leg swelling.  He is using oxygen 24/7.  He has not used CPAP for the past 1 year.  He had trouble with mask fit, and air blowing into his eyes.  TESTS: CT chest 12/24/10>>No PE.  RLL scar vs ATX, emphysema, 1.4 cm nodular scar ant RUL increase in size since 2008 PSG 02/23/11>>AHI 0.3, RDI 18.6, SpO2 low 82%.  Spent 168 min with SpO2 < 88%.  Used 2 liters oxygen during test. PET scan 03/07/11 >> no activity RUL density 03/13/11 Ambulatory SpO2 on room air >> 87%. PFT 03/14/11>>FEV1 2.42(84%), FEV1% 58, TLC 7.22(108%), DLCO 44%, no BD. CT chest 07/21/11 >> RUL nodule resolved, centrilobular emphysema ONO with 3 liters 04/22/12 >> Test time 11 hrs 33 min.  Mean SpO2 92%, low SpO2 76%.  Spent 46 min with SpO2 < 88%. ONO with 4 liters 05/04/12 >> Test time 11 hrs.  Mean SpO2 91%, low SpO2 74%.  Spent 1 hr 55 min with SpO2 < 88%.  He  has a past medical history of Cardiomyopathy secondary; Coronary atherosclerosis of native coronary artery; Type 2 diabetes mellitus; Atrial flutter; Essential hypertension, benign; Cerebral aneurysm; Mixed hyperlipidemia; Atrial fibrillation; Carotid stenosis; COPD (chronic obstructive pulmonary disease); Lung nodule; History of tuberculosis; Diabetes mellitus; OSA (obstructive sleep apnea) (01/01/2011); and Hypoxemia (03/13/2011).  He  has past surgical history that includes Coronary artery bypass graft (2005).  Outpatient Encounter Prescriptions as of 10/18/2012  Medication Sig Dispense  Refill  . acetaminophen (TYLENOL) 500 MG tablet Take 500 mg by mouth every 4 (four) hours as needed.        Marland Kitchen aspirin 325 MG EC tablet Take 1 tablet (325 mg total) by mouth daily.      . carvedilol (COREG) 12.5 MG tablet Take 1 tablet (12.5 mg total) by mouth 2 (two) times daily with a meal.  60 tablet  5  . furosemide (LASIX) 20 MG tablet Take 1 tablet (20 mg total) by mouth 2 (two) times daily.  60 tablet  6  . glipiZIDE (GLUCOTROL) 10 MG tablet Take 10 mg by mouth 2 (two) times daily before a meal.        . isosorbide mononitrate (IMDUR) 30 MG 24 hr tablet Take 1 tablet (30 mg total) by mouth daily.  30 tablet  6  . metFORMIN (GLUCOPHAGE) 1000 MG tablet Take 1,000 mg by mouth 2 (two) times daily with a meal.        . ranitidine (ZANTAC) 150 MG capsule Take 150 mg by mouth every evening.        . simvastatin (ZOCOR) 20 MG tablet Take 20 mg by mouth at bedtime.        Marland Kitchen tiotropium (SPIRIVA HANDIHALER) 18 MCG inhalation capsule Place 1 capsule (18 mcg total) into inhaler and inhale daily.  30 capsule  12  . albuterol (VENTOLIN HFA) 108 (90 BASE) MCG/ACT inhaler Inhale 2 puffs into the lungs every 6 (six) hours as needed for wheezing.  1 Inhaler  6  No facility-administered encounter medications on file as of 10/18/2012.    No Known Allergies  Physical Exam:  General - No distress, wearing oxygen ENT - No sinus tenderness, no oral exudate, no LAN Cardiac - s1s2 regular, no murmur Chest - No wheeze/rales/dullness Back - No focal tenderness Abd - Soft, non-tender Ext - No edema Neuro - Normal strength Skin - No rashes Psych - normal mood, and behavior   Assessment/Plan:  Coralyn Helling, MD [facility] Pager:  [phone] 10/18/2012, 2:31 PM

## 2012-10-18 NOTE — Assessment & Plan Note (Signed)
- 

## 2012-10-19 ENCOUNTER — Ambulatory Visit (HOSPITAL_BASED_OUTPATIENT_CLINIC_OR_DEPARTMENT_OTHER): Payer: Medicare Other | Attending: Pulmonary Disease

## 2012-10-19 DIAGNOSIS — R259 Unspecified abnormal involuntary movements: Secondary | ICD-10-CM | POA: Insufficient documentation

## 2012-10-19 DIAGNOSIS — J4489 Other specified chronic obstructive pulmonary disease: Secondary | ICD-10-CM | POA: Insufficient documentation

## 2012-10-19 DIAGNOSIS — G4734 Idiopathic sleep related nonobstructive alveolar hypoventilation: Secondary | ICD-10-CM | POA: Insufficient documentation

## 2012-10-19 DIAGNOSIS — G4733 Obstructive sleep apnea (adult) (pediatric): Secondary | ICD-10-CM

## 2012-10-19 DIAGNOSIS — J449 Chronic obstructive pulmonary disease, unspecified: Secondary | ICD-10-CM | POA: Insufficient documentation

## 2012-11-02 DIAGNOSIS — G4734 Idiopathic sleep related nonobstructive alveolar hypoventilation: Secondary | ICD-10-CM

## 2012-11-03 NOTE — Procedures (Signed)
NAME:  Jared Santos, GEESEY NO.:  [id number]  MEDICAL RECORD NO.:  [id number]          PATIENT TYPE:  OUT  LOCATION:  SLEEP CENTER                 FACILITY:  [facility]  PHYSICIAN:  Oretha Milch, MD      DATE OF BIRTH:  02-01-1935  DATE OF STUDY:  10/19/2012                           NOCTURNAL POLYSOMNOGRAM  REFERRING PHYSICIAN:  Coralyn Helling, MD  INDICATION FOR STUDY:  Mr. Burstein is a 78 year old former smoker with COPD on nocturnal oxygen.  Polysomnogram in November 2012 did not show significant sleep apnea.  His nocturnal oximetry on 4 L of oxygen continues to show nocturnal desaturation and also has cardiomyopathy. At the time of this study, he weighed 191 pounds with a height of 5 feet 11 inches, BMI of 36, neck size of 16 inches.  EPWORTH SLEEPINESS SCORE:  11.  This nocturnal polysomnogram was performed with a sleep technologist in attendance.  EEG, EOG, EMG, EKG, and respiratory parameters were recorded.  Sleep stages, arousals, limb movements, and respiratory data were scored according to criteria laid out by the American Academy of Sleep Medicine.  SLEEP ARCHITECTURE:  Lights out was at 10:11 p.m. and lights on was at 5:06 a.m.  Total sleep time was 271 minutes with a sleep period time of 397 minutes and a sleep efficiency of 65%.  Sleep latency was 17 minutes.  Latency to REM sleep was 71 minutes and wake after sleep onset was 126 minutes.  Sleep stages of the percentage of total sleep time was N1 18%, N2 64%, N3 0%, REM sleep 17%.  No supine sleep was noted.  REM sleep was noted in small stages with the longest around 5:00 a.m.  AROUSAL DATA:  There were 38 arousals with an arousal index of 8 events per hour, of these 23 were spontaneous and the rest were associated with limb movement.  RESPIRATORY DATA:  There were 0 obstructive apneas, 0 central apnea, 0 mixed apneas, and 1 hypopnea with an apnea-hypopnea index of 0.2 events per hour.  LIMB MOVEMENT  DATA:  The lowest PLM index was 20 events per hour. However, the PLM arousal index was 1.8 events per hour.  OXYGEN SATURATION DATA:  The lowest desaturation was 83%.  Oxygen was initiated 1 L and increased to 3 V.  No significant desaturations were noted on 3 L of oxygen.  We spent 90 minutes with a saturation less than 80% during the study.  CARDIAC DATA:  The low heart rate was 32 beats per minute.  The high heart rate recorded was an artifact.  No arrhythmias were noted.  DISCUSSION:  Did not meet criteria for split night intervention.  IMPRESSION: 1. No evidence of obstructive sleep apnea. 2. Sleep related hypoxemia level corrected with 3 L of oxygen.  This     is likely related to underlying cardiopulmonary disease. 3. No evidence of cardiac arrhythmias or behavioral disturbance during     sleep. 4. Significant periodic limb movements were noted.  However, these     were not associated with arousals.  RECOMMENDATIONS: 1. Oxygen at 3 L/minute, should be applied during sleep. 2. Please correlate with a clinical history of  restless legs syndrome.     Oretha Milch, MD    RVA/MEDQ  D:  11/02/2012 08:39:04  T:  11/03/2012 00:01:01  Job:  [id number]

## 2012-11-12 ENCOUNTER — Telehealth: Payer: Self-pay | Admitting: Pulmonary Disease

## 2012-11-12 NOTE — Telephone Encounter (Signed)
lmtcb

## 2012-11-12 NOTE — Telephone Encounter (Signed)
Will have my nurse inform pt that sleep study from 11/03/12 did not show sleep apnea.  He did have hypoventilation (shallow breathing) while asleep.  He needs to continue using oxygen at night.  No other change to current treatment plan.

## 2012-11-15 NOTE — Telephone Encounter (Signed)
Pt is aware of sleep study results. 

## 2013-03-14 ENCOUNTER — Encounter: Payer: Self-pay | Admitting: *Deleted

## 2013-03-14 ENCOUNTER — Encounter: Payer: Self-pay | Admitting: Pulmonary Disease

## 2013-03-14 ENCOUNTER — Ambulatory Visit (INDEPENDENT_AMBULATORY_CARE_PROVIDER_SITE_OTHER): Payer: Medicare Other | Admitting: Pulmonary Disease

## 2013-03-14 ENCOUNTER — Telehealth: Payer: Self-pay | Admitting: Cardiology

## 2013-03-14 VITALS — BP 140/90 | HR 87 | Ht 71.0 in | Wt 192.0 lb

## 2013-03-14 DIAGNOSIS — J439 Emphysema, unspecified: Secondary | ICD-10-CM

## 2013-03-14 DIAGNOSIS — J438 Other emphysema: Secondary | ICD-10-CM

## 2013-03-14 DIAGNOSIS — G4733 Obstructive sleep apnea (adult) (pediatric): Secondary | ICD-10-CM

## 2013-03-14 DIAGNOSIS — R0902 Hypoxemia: Secondary | ICD-10-CM

## 2013-03-14 DIAGNOSIS — J9611 Chronic respiratory failure with hypoxia: Secondary | ICD-10-CM

## 2013-03-14 DIAGNOSIS — J9610 Chronic respiratory failure, unspecified whether with hypoxia or hypercapnia: Secondary | ICD-10-CM

## 2013-03-14 MED ORDER — FUROSEMIDE 20 MG PO TABS: 20.0000 mg | ORAL_TABLET | Freq: Two times a day (BID) | ORAL | Status: DC

## 2013-03-14 NOTE — Telephone Encounter (Signed)
Noted pt is overdue for his follow up, left vm to advise to call office to schedule, also placed note in pt refill/mailed letter to pt home to advise he needs to call office to schedule his f/u sent refill to last one month to allow time to schedule apt

## 2013-03-14 NOTE — Patient Instructions (Signed)
Follow up in 6 months 

## 2013-03-14 NOTE — Progress Notes (Signed)
Chief Complaint  Patient presents with  . COPD    Breathing is improved. When he came in O2 level was at 84%.    History of Present Illness: Jared Santos is a 78 y.o. male former smoker with dyspnea, COPD/emphysema.  His breathing has been okay.  He uses 3 liters oxygen.  He is not having much cough, sputum, or wheeze.  He denies hemoptysis or leg swelling.  He uses albuterol 4 or 5 times per week.  He sometimes feels like food gets stuck in his stomach.  He does occasionally get heartburn.  TESTS: CT chest 12/24/10>>No PE.  RLL scar vs ATX, emphysema, 1.4 cm nodular scar ant RUL increase in size since 2008 PSG 02/23/11>>AHI 0.3, RDI 18.6, SpO2 low 82%.  Spent 168 min with SpO2 < 88%.  Used 2 liters oxygen during test. PET scan 03/07/11 >> no activity RUL density 03/13/11 Ambulatory SpO2 on room air >> 87%. PFT 03/14/11>>FEV1 2.42(84%), FEV1% 58, TLC 7.22(108%), DLCO 44%, no BD. CT chest 07/21/11 >> RUL nodule resolved, centrilobular emphysema ONO with 3 liters 04/22/12 >> Test time 11 hrs 33 min.  Mean SpO2 92%, low SpO2 76%.  Spent 46 min with SpO2 < 88%. ONO with 4 liters 05/04/12 >> Test time 11 hrs.  Mean SpO2 91%, low SpO2 74%.  Spent 1 hr 55 min with SpO2 < 88%. Echo 05/12/12 >> EF 55%, mod LA dilation, mild/mod RA dilation, PAS 62 mmHg PSG 10/19/12 >> AHI 0.2, used 3 liters oxygen  He  has a past medical history of Cardiomyopathy secondary; Coronary atherosclerosis of native coronary artery; Type 2 diabetes mellitus; Atrial flutter; Essential hypertension, benign; Cerebral aneurysm; Mixed hyperlipidemia; Atrial fibrillation; Carotid stenosis; COPD (chronic obstructive pulmonary disease); Lung nodule; History of tuberculosis; Diabetes mellitus; OSA (obstructive sleep apnea) (01/01/2011); and Hypoxemia (03/13/2011).  He  has past surgical history that includes Coronary artery bypass graft (2005).  Outpatient Encounter Prescriptions as of 03/14/2013  Medication Sig  . acetaminophen  (TYLENOL) 500 MG tablet Take 500 mg by mouth every 4 (four) hours as needed.    Marland Kitchen albuterol (VENTOLIN HFA) 108 (90 BASE) MCG/ACT inhaler Inhale 2 puffs into the lungs every 6 (six) hours as needed for wheezing.  Marland Kitchen aspirin 81 MG tablet Take 81 mg by mouth daily.  . carvedilol (COREG) 12.5 MG tablet Take 1 tablet (12.5 mg total) by mouth 2 (two) times daily with a meal.  . furosemide (LASIX) 20 MG tablet Take 1 tablet (20 mg total) by mouth 2 (two) times daily.  Marland Kitchen glipiZIDE (GLUCOTROL) 10 MG tablet Take 10 mg by mouth 2 (two) times daily before a meal.    . isosorbide mononitrate (IMDUR) 30 MG 24 hr tablet Take 1 tablet (30 mg total) by mouth daily.  . metFORMIN (GLUCOPHAGE) 1000 MG tablet Take 1,000 mg by mouth 2 (two) times daily with a meal.    . ranitidine (ZANTAC) 150 MG capsule Take 150 mg by mouth every evening.    . simvastatin (ZOCOR) 20 MG tablet Take 20 mg by mouth at bedtime.    Marland Kitchen tiotropium (SPIRIVA HANDIHALER) 18 MCG inhalation capsule Place 1 capsule (18 mcg total) into inhaler and inhale daily.  . [DISCONTINUED] aspirin 325 MG EC tablet Take 1 tablet (325 mg total) by mouth daily.    No Known Allergies  Physical Exam:  General - No distress, wearing oxygen ENT - No sinus tenderness, no oral exudate, no LAN Cardiac - s1s2 regular, no murmur Chest - No  wheeze/rales/dullness Back - No focal tenderness Abd - Soft, non-tender Ext - No edema Neuro - Normal strength Skin - No rashes Psych - normal mood, and behavior   Assessment/Plan:  Coralyn Helling, MD [place] [facility]/Sleep Pager:  [phone] 03/14/2013, 11:18 AM

## 2013-03-14 NOTE — Telephone Encounter (Signed)
Received fax refill request  Rx # [id number] Medication:  Furosemide 20 mg tablet Qty 60 Sig:  Take one tablet twice a day Physician:  Diona Browner

## 2013-03-17 ENCOUNTER — Other Ambulatory Visit (HOSPITAL_COMMUNITY): Payer: Self-pay | Admitting: Family Medicine

## 2013-03-17 DIAGNOSIS — R1310 Dysphagia, unspecified: Secondary | ICD-10-CM

## 2013-03-17 NOTE — Assessment & Plan Note (Signed)
Most recent sleep study from July 2014 did not show sleep apnea.

## 2013-03-17 NOTE — Assessment & Plan Note (Signed)
Stable on his inhaler regimen.

## 2013-03-17 NOTE — Assessment & Plan Note (Signed)
He is to continue 3 liters oxygen.

## 2013-03-21 ENCOUNTER — Ambulatory Visit (HOSPITAL_COMMUNITY)
Admission: RE | Admit: 2013-03-21 | Discharge: 2013-03-21 | Disposition: A | Discharge status: Home / Self Care | Payer: Medicare Other | Source: Ambulatory Visit | Attending: Family Medicine | Admitting: Family Medicine

## 2013-03-21 DIAGNOSIS — R1310 Dysphagia, unspecified: Secondary | ICD-10-CM

## 2013-03-21 DIAGNOSIS — K219 Gastro-esophageal reflux disease without esophagitis: Secondary | ICD-10-CM | POA: Insufficient documentation

## 2013-04-17 ENCOUNTER — Other Ambulatory Visit: Payer: Self-pay | Admitting: Cardiology

## 2013-05-04 ENCOUNTER — Telehealth: Payer: Self-pay

## 2013-05-04 MED ORDER — CARVEDILOL 12.5 MG PO TABS: 12.5000 mg | ORAL_TABLET | Freq: Two times a day (BID) | ORAL | Status: DC

## 2013-05-04 NOTE — Telephone Encounter (Signed)
Received fax refill request  Rx # [id number] Medication:  Carvedilol 12.5 mg tablet Qty 60 Sig:  Take 1 tablet by mouth twice daily Physician:  mcdowell

## 2013-05-04 NOTE — Telephone Encounter (Signed)
Medication sent via escribe.  

## 2013-05-10 ENCOUNTER — Encounter: Payer: Self-pay | Admitting: Cardiology

## 2013-05-10 ENCOUNTER — Encounter (INDEPENDENT_AMBULATORY_CARE_PROVIDER_SITE_OTHER): Payer: Self-pay

## 2013-05-10 ENCOUNTER — Ambulatory Visit (INDEPENDENT_AMBULATORY_CARE_PROVIDER_SITE_OTHER): Payer: Medicare Other | Admitting: Cardiology

## 2013-05-10 VITALS — BP 130/82 | HR 61 | Ht 71.0 in | Wt 190.0 lb

## 2013-05-10 DIAGNOSIS — I25810 Atherosclerosis of coronary artery bypass graft(s) without angina pectoris: Secondary | ICD-10-CM

## 2013-05-10 DIAGNOSIS — J439 Emphysema, unspecified: Secondary | ICD-10-CM

## 2013-05-10 DIAGNOSIS — I2589 Other forms of chronic ischemic heart disease: Secondary | ICD-10-CM

## 2013-05-10 DIAGNOSIS — I255 Ischemic cardiomyopathy: Secondary | ICD-10-CM

## 2013-05-10 DIAGNOSIS — I10 Essential (primary) hypertension: Secondary | ICD-10-CM

## 2013-05-10 DIAGNOSIS — I2510 Atherosclerotic heart disease of native coronary artery without angina pectoris: Secondary | ICD-10-CM

## 2013-05-10 DIAGNOSIS — J438 Other emphysema: Secondary | ICD-10-CM

## 2013-05-10 DIAGNOSIS — E785 Hyperlipidemia, unspecified: Secondary | ICD-10-CM

## 2013-05-10 MED ORDER — SIMVASTATIN 20 MG PO TABS: 20.0000 mg | ORAL_TABLET | Freq: Every day | ORAL | Status: DC

## 2013-05-10 NOTE — Assessment & Plan Note (Signed)
Symptomatically stable on medical therapy. ECG reviewed. Continue walking for exercise. No change in current regimen. Will keep an annual followup, although can see him sooner if symptoms escalate.

## 2013-05-10 NOTE — Assessment & Plan Note (Signed)
Continue followup with Dr. Halford Chessman.

## 2013-05-10 NOTE — Assessment & Plan Note (Signed)
LVEF improved to 55% by echocardiogram last year. Continue medical therapy.

## 2013-05-10 NOTE — Assessment & Plan Note (Signed)
Continue current regimen

## 2013-05-10 NOTE — Progress Notes (Signed)
Clinical Summary [name] is a 79 y.o.male last seen in February 2014. He continues to follow with Dr. Halford Santos for pulmonary assessment. He reports occasional angina symptoms. Fortunately, has had no major illnesses over the winter, no hospitalizations. He continues to use oxygen when he is ambulatory and in the evenings.  Followup echocardiogram in February 2014 showed improvement in LVEF to 55%, septal dyssynergy, trivial aortic regard to, moderate left atrial enlargement, mild to moderate right atrial enlargement, PASP 62 mm mercury. Weeviewed this today.  Myoview study in November 2012 was abnormal, consistent with CAD, prior anteroseptal infarct as well as possible inferior scar, no significant ischemia, LVEF was 43% at that time.  ECG today shows sinus rhythm with PACs and right bundle branch block with prior anterior infarct pattern.   No Known Allergies  Current Outpatient Prescriptions  Medication Sig Dispense Refill  . acetaminophen (TYLENOL) 500 MG tablet Take 500 mg by mouth every 4 (four) hours as needed.        Marland Kitchen albuterol (VENTOLIN HFA) 108 (90 BASE) MCG/ACT inhaler Inhale 2 puffs into the lungs every 6 (six) hours as needed for wheezing.  1 Inhaler  6  . aspirin 81 MG tablet Take 81 mg by mouth daily.      . carvedilol (COREG) 12.5 MG tablet Take 1 tablet (12.5 mg total) by mouth 2 (two) times daily with a meal.  60 tablet  5  . furosemide (LASIX) 20 MG tablet Take 1 tablet (20 mg total) by mouth 2 (two) times daily.  60 tablet  0  . glipiZIDE (GLUCOTROL) 10 MG tablet Take 10 mg by mouth 2 (two) times daily before a meal.        . isosorbide mononitrate (IMDUR) 30 MG 24 hr tablet take 1 tablet by mouth once daily  30 tablet  6  . metFORMIN (GLUCOPHAGE) 1000 MG tablet Take 1,000 mg by mouth 2 (two) times daily with a meal.        . ranitidine (ZANTAC) 150 MG capsule Take 150 mg by mouth every evening.        . simvastatin (ZOCOR) 20 MG tablet Take 1 tablet (20 mg total) by  mouth at bedtime.  30 tablet  12  . tiotropium (SPIRIVA HANDIHALER) 18 MCG inhalation capsule Place 1 capsule (18 mcg total) into inhaler and inhale daily.  30 capsule  12   No current facility-administered medications for this visit.    Past Medical History  Diagnosis Date  . Cardiomyopathy secondary     LVEF 60-65% 9/12, LVEF 43% by Myoview 11/12  . Coronary atherosclerosis of native coronary artery     s/p MI in 2005, s/p DES to RCA in 2005;  s/p CABG in 3/09: L-LAD, S-Dx, S-OM, S-dRCA/PDA  . Type 2 diabetes mellitus   . Atrial flutter     s/p RFCA 2005  . Essential hypertension, benign   . Cerebral aneurysm     s/p clipping   . Mixed hyperlipidemia   . Atrial fibrillation   . Carotid stenosis     h/o occluded RICA  . COPD (chronic obstructive pulmonary disease)   . Lung nodule   . History of tuberculosis     1971  . Diabetes mellitus   . OSA (obstructive sleep apnea) 01/01/2011  . Hypoxemia 03/13/2011    Past Surgical History  Procedure Laterality Date  . Coronary artery bypass graft  2005    LIMA to LAD, SVG to diagonal, SVG to OM,  SVG to RCA and PDA    Social History [name] reports that he quit smoking about 27 years ago. His smoking use included Cigarettes. He has a 35 pack-year smoking history. He has never used smokeless tobacco. [name] reports that he does not drink alcohol.  Review of Systems No palpitations or syncope. No leg edema. No orthopnea. Otherwise as outlined.  Physical Examination Filed Vitals:   05/10/13 0845  BP: 130/82  Pulse: 61   Filed Weights   05/10/13 0845  Weight: 190 lb (86.183 kg)    Normally nourished appearing male, NAD, wearing oxygen.  HEENT: Conjunctiva and lids normal, oropharynx with moist mucosa.  Neck: Supple, no elevated JVP, no thyromegaly.  Lungs: Diminished but clear breath sounds, nonlabored, no wheezing.  Cardiac: Regular rate and rhythm with occasional ectopic beat, soft systolic murmur, no S3.    Abdomen: Soft, nontender, bowel sounds present.  Skin: Warm and dry.  Musculoskeletal: No kyphosis.  Extremities: Trace ankle edema, distal pulses one plus.  Neuropsychiatric: Alert and oriented x3, moves all extremities equally.   Problem List and Plan   CORONARY ATHEROSCLEROSIS, NATIVE VESSEL Symptomatically stable on medical therapy. ECG reviewed. Continue walking for exercise. No change in current regimen. Will keep an annual followup, although can see him sooner if symptoms escalate.  Ischemic cardiomyopathy LVEF improved to 55% by echocardiogram last year. Continue medical therapy.  COPD with emphysema Continue followup with Dr. Halford Santos.  Essential hypertension, benign Continue current regimen.  HYPERLIPIDEMIA Continues on Zocor. Keep follow up lipids with Dr. Berdine Santos.    Jared Santos, M.D., F.A.C.C.

## 2013-05-10 NOTE — Patient Instructions (Addendum)
Your physician wants you to follow-up in: 1 year You will receive a reminder letter in the mail two months in advance. If you don't receive a letter, please call our office to schedule the follow-up appointment.      Thanks for choosing [facility] !

## 2013-05-10 NOTE — Assessment & Plan Note (Signed)
Continues on Zocor. Keep follow up lipids with Dr. Berdine Addison.

## 2013-05-20 ENCOUNTER — Other Ambulatory Visit: Payer: Self-pay | Admitting: *Deleted

## 2013-05-20 ENCOUNTER — Telehealth: Payer: Self-pay | Admitting: Pulmonary Disease

## 2013-05-20 MED ORDER — TIOTROPIUM BROMIDE MONOHYDRATE 18 MCG IN CAPS: 18.0000 ug | ORAL_CAPSULE | Freq: Every day | RESPIRATORY_TRACT | Status: DC

## 2013-05-20 NOTE — Telephone Encounter (Signed)
Attempted to call the pt but no answer and no machine.  Will try back later.

## 2013-05-23 NOTE — Telephone Encounter (Signed)
I spoke with the pt and he states that the spiriva has increased to $240 a month and this is too expensive. I advised the pt about checking his formulary but pt did not understand what this was. I advised that there is not way for [place] to know what is cheaper so we can send a message to [facility] to see what he recommends but that it may not be any cheaper. Pt states understanding.  Please advise if we can send in an alternative that may be cheaper for the pt. Cole Bing, CMA No Known Allergies

## 2013-05-25 NOTE — Telephone Encounter (Signed)
lmtcb x1 

## 2013-05-25 NOTE — Telephone Encounter (Signed)
Options are to change to [place], anoro, or combivent.  Not sure any of these options would be less expensive.  If this is still an issue, then please arrange for ROV with Tammy Parrett for medication reconciliation.

## 2013-05-26 NOTE — Telephone Encounter (Signed)
ATC line busy x 4 wcb 

## 2013-05-27 NOTE — Telephone Encounter (Signed)
I spoke with the pt and he states he spoke with insurance and his spiriva is now $45 again so he wants to stay on this. [place] Bing, CMA

## 2013-06-09 ENCOUNTER — Ambulatory Visit (INDEPENDENT_AMBULATORY_CARE_PROVIDER_SITE_OTHER): Payer: Medicare Other | Admitting: Pulmonary Disease

## 2013-06-09 ENCOUNTER — Encounter: Payer: Self-pay | Admitting: Pulmonary Disease

## 2013-06-09 VITALS — BP 136/70 | HR 74 | Temp 97.5°F | Ht 71.0 in | Wt 195.6 lb

## 2013-06-09 DIAGNOSIS — J438 Other emphysema: Secondary | ICD-10-CM

## 2013-06-09 NOTE — Progress Notes (Signed)
   Subjective:    Patient ID: Jared Santos, male    DOB: 1934/05/06, 79 y.o.   MRN: [id number]  HPI  79 y.o. male former smoker with  dyspnea,COPD/emphysema.  His breathing has been okay. He uses 3 liters oxygen. He is not having much cough, sputum, or wheeze. He denies hemoptysis or leg swelling. He uses albuterol 4 or 5 times per week.  He sometimes feels like food gets stuck in his stomach. He does occasionally get heartburn.  TESTS:  CT chest 12/24/10>>No PE. RLL scar vs ATX, emphysema, 1.4 cm nodular scar ant RUL increase in size since 2008  PSG 02/23/11>>AHI 0.3, RDI 18.6, SpO2 low 82%. Spent 168 min with SpO2 < 88%. Used 2 liters oxygen during test.  PET scan 03/07/11 >> no activity RUL density  03/13/11 Ambulatory SpO2 on room air >> 87%.  PFT 03/14/11>>FEV1 2.42(84%), FEV1% 58, TLC 7.22(108%), DLCO 44%, no BD.  CT chest 07/21/11 >> RUL nodule resolved, centrilobular emphysema  ONO with 3 liters 04/22/12 >> Test time 11 hrs 33 min. Mean SpO2 92%, low SpO2 76%. Spent 46 min with SpO2 < 88%.  ONO with 4 liters 05/04/12 >> Test time 11 hrs. Mean SpO2 91%, low SpO2 74%. Spent 1 hr 55 min with SpO2 < 88%.  Echo 05/12/12 >> EF 55%, mod LA dilation, mild/mod RA dilation, PAS 62 mmHg  PSG 10/19/12 >> AHI 0.2, used 3 liters oxygen    He has a past medical history of Cardiomyopathy secondary; Coronary atherosclerosis of native coronary artery; Type 2 diabetes mellitus; Atrial flutter; Essential hypertension, benign; Cerebral aneurysm; Mixed hyperlipidemia; Atrial fibrillation; Carotid stenosis; COPD (chronic obstructive pulmonary disease); Lung nodule; History of tuberculosis; Diabetes mellitus; OSA (obstructive sleep apnea) (01/01/2011); and Hypoxemia (03/13/2011   Review of Systems  Constitutional: Positive for unexpected weight change. Negative for fever.  HENT: Positive for congestion and trouble swallowing. Negative for dental problem, ear pain, nosebleeds, postnasal drip, rhinorrhea, sinus  pressure, sneezing and sore throat.   Eyes: Negative for redness and itching.  Respiratory: Positive for cough and shortness of breath. Negative for chest tightness and wheezing.   Cardiovascular: Positive for palpitations. Negative for leg swelling.  Gastrointestinal: Negative for nausea and vomiting.  Genitourinary: Negative for dysuria.  Musculoskeletal: Negative for joint swelling.  Skin: Negative for rash.  Neurological: Positive for numbness and headaches.  Hematological: Does not bruise/bleed easily.  Psychiatric/Behavioral: Negative for dysphoric mood. The patient is not nervous/anxious.        Objective:   Physical Exam        Assessment & Plan:

## 2013-07-11 ENCOUNTER — Telehealth: Payer: Self-pay | Admitting: Cardiology

## 2013-07-11 MED ORDER — FUROSEMIDE 20 MG PO TABS: 20.0000 mg | ORAL_TABLET | Freq: Two times a day (BID) | ORAL | Status: DC

## 2013-07-11 NOTE — Telephone Encounter (Signed)
Received fax refill request  Rx # [id number] Medication:  Furosemide 20 mg tablet Qty 60 Sig:  Take one tablet by mouth twice a day Physician:  Domenic Polite

## 2013-07-11 NOTE — Telephone Encounter (Signed)
rx refill complete per request

## 2013-11-28 ENCOUNTER — Telehealth: Payer: Self-pay | Admitting: Cardiology

## 2013-11-28 MED ORDER — CARVEDILOL 12.5 MG PO TABS: 12.5000 mg | ORAL_TABLET | Freq: Two times a day (BID) | ORAL | Status: DC

## 2013-11-28 NOTE — Telephone Encounter (Signed)
Refill request complete 

## 2013-12-10 ENCOUNTER — Other Ambulatory Visit: Payer: Self-pay | Admitting: Cardiology

## 2013-12-10 ENCOUNTER — Emergency Department (HOSPITAL_COMMUNITY): Payer: Medicare Other

## 2013-12-10 ENCOUNTER — Encounter (HOSPITAL_COMMUNITY): Payer: Self-pay | Admitting: Emergency Medicine

## 2013-12-10 ENCOUNTER — Inpatient Hospital Stay (HOSPITAL_COMMUNITY)
Admission: EM | Admit: 2013-12-10 | Discharge: 2013-12-14 | DRG: 291 | Disposition: A | Discharge status: Home / Self Care | Payer: Medicare Other | Attending: Internal Medicine | Admitting: Internal Medicine

## 2013-12-10 ENCOUNTER — Inpatient Hospital Stay (HOSPITAL_COMMUNITY): Payer: Medicare Other

## 2013-12-10 DIAGNOSIS — G4733 Obstructive sleep apnea (adult) (pediatric): Secondary | ICD-10-CM | POA: Diagnosis present

## 2013-12-10 DIAGNOSIS — I252 Old myocardial infarction: Secondary | ICD-10-CM | POA: Diagnosis not present

## 2013-12-10 DIAGNOSIS — I10 Essential (primary) hypertension: Secondary | ICD-10-CM

## 2013-12-10 DIAGNOSIS — I5043 Acute on chronic combined systolic (congestive) and diastolic (congestive) heart failure: Principal | ICD-10-CM | POA: Diagnosis present

## 2013-12-10 DIAGNOSIS — Z7982 Long term (current) use of aspirin: Secondary | ICD-10-CM | POA: Diagnosis not present

## 2013-12-10 DIAGNOSIS — J189 Pneumonia, unspecified organism: Secondary | ICD-10-CM | POA: Diagnosis present

## 2013-12-10 DIAGNOSIS — I2510 Atherosclerotic heart disease of native coronary artery without angina pectoris: Secondary | ICD-10-CM | POA: Diagnosis present

## 2013-12-10 DIAGNOSIS — Z951 Presence of aortocoronary bypass graft: Secondary | ICD-10-CM | POA: Diagnosis not present

## 2013-12-10 DIAGNOSIS — E785 Hyperlipidemia, unspecified: Secondary | ICD-10-CM | POA: Diagnosis present

## 2013-12-10 DIAGNOSIS — J9611 Chronic respiratory failure with hypoxia: Secondary | ICD-10-CM

## 2013-12-10 DIAGNOSIS — J439 Emphysema, unspecified: Secondary | ICD-10-CM | POA: Diagnosis present

## 2013-12-10 DIAGNOSIS — Z9981 Dependence on supplemental oxygen: Secondary | ICD-10-CM

## 2013-12-10 DIAGNOSIS — J438 Other emphysema: Secondary | ICD-10-CM

## 2013-12-10 DIAGNOSIS — R0602 Shortness of breath: Secondary | ICD-10-CM | POA: Diagnosis not present

## 2013-12-10 DIAGNOSIS — E782 Mixed hyperlipidemia: Secondary | ICD-10-CM | POA: Diagnosis present

## 2013-12-10 DIAGNOSIS — J9620 Acute and chronic respiratory failure, unspecified whether with hypoxia or hypercapnia: Secondary | ICD-10-CM | POA: Diagnosis present

## 2013-12-10 DIAGNOSIS — Z9861 Coronary angioplasty status: Secondary | ICD-10-CM

## 2013-12-10 DIAGNOSIS — E119 Type 2 diabetes mellitus without complications: Secondary | ICD-10-CM | POA: Diagnosis present

## 2013-12-10 DIAGNOSIS — I6529 Occlusion and stenosis of unspecified carotid artery: Secondary | ICD-10-CM

## 2013-12-10 DIAGNOSIS — I4891 Unspecified atrial fibrillation: Secondary | ICD-10-CM | POA: Diagnosis present

## 2013-12-10 DIAGNOSIS — Z8611 Personal history of tuberculosis: Secondary | ICD-10-CM | POA: Diagnosis not present

## 2013-12-10 DIAGNOSIS — I2589 Other forms of chronic ischemic heart disease: Secondary | ICD-10-CM | POA: Diagnosis present

## 2013-12-10 DIAGNOSIS — J441 Chronic obstructive pulmonary disease with (acute) exacerbation: Secondary | ICD-10-CM | POA: Diagnosis present

## 2013-12-10 DIAGNOSIS — I5022 Chronic systolic (congestive) heart failure: Secondary | ICD-10-CM

## 2013-12-10 DIAGNOSIS — R0902 Hypoxemia: Secondary | ICD-10-CM

## 2013-12-10 DIAGNOSIS — N390 Urinary tract infection, site not specified: Secondary | ICD-10-CM | POA: Diagnosis present

## 2013-12-10 DIAGNOSIS — I255 Ischemic cardiomyopathy: Secondary | ICD-10-CM

## 2013-12-10 DIAGNOSIS — Z87891 Personal history of nicotine dependence: Secondary | ICD-10-CM | POA: Diagnosis not present

## 2013-12-10 DIAGNOSIS — Z79899 Other long term (current) drug therapy: Secondary | ICD-10-CM | POA: Diagnosis not present

## 2013-12-10 LAB — URINALYSIS, ROUTINE W REFLEX MICROSCOPIC
Bilirubin Urine: NEGATIVE
Glucose, UA: >1000 mg/dL — AB
KETONES UR: NEGATIVE mg/dL
NITRITE: POSITIVE — AB
PH: 5.5 (ref 5.0–8.0)
Protein, ur: 100 mg/dL — AB
Specific Gravity, Urine: 1.022 (ref 1.005–1.030)
Urobilinogen, UA: 1 mg/dL (ref 0.0–1.0)

## 2013-12-10 LAB — CBC
HEMATOCRIT: 46.2 % (ref 39.0–52.0)
Hemoglobin: 15.9 g/dL (ref 13.0–17.0)
MCH: 28.4 pg (ref 26.0–34.0)
MCHC: 34.4 g/dL (ref 30.0–36.0)
MCV: 82.5 fL (ref 78.0–100.0)
Platelets: 96 10*3/uL — ABNORMAL LOW (ref 150–400)
RBC: 5.6 MIL/uL (ref 4.22–5.81)
RDW: 17.2 % — ABNORMAL HIGH (ref 11.5–15.5)
WBC: 10.3 10*3/uL (ref 4.0–10.5)

## 2013-12-10 LAB — HEPATIC FUNCTION PANEL
ALT: 25 U/L (ref 0–53)
AST: 22 U/L (ref 0–37)
Albumin: 3.5 g/dL (ref 3.5–5.2)
Alkaline Phosphatase: 78 U/L (ref 39–117)
BILIRUBIN INDIRECT: 2.1 mg/dL — AB (ref 0.3–0.9)
Bilirubin, Direct: 0.3 mg/dL (ref 0.0–0.3)
TOTAL PROTEIN: 7.5 g/dL (ref 6.0–8.3)
Total Bilirubin: 2.4 mg/dL — ABNORMAL HIGH (ref 0.3–1.2)

## 2013-12-10 LAB — MAGNESIUM: Magnesium: 1.5 mg/dL (ref 1.5–2.5)

## 2013-12-10 LAB — CBC WITH DIFFERENTIAL/PLATELET
BASOS PCT: 0 % (ref 0–1)
Basophils Absolute: 0 10*3/uL (ref 0.0–0.1)
Eosinophils Absolute: 0 10*3/uL (ref 0.0–0.7)
Eosinophils Relative: 0 % (ref 0–5)
HEMATOCRIT: 47.8 % (ref 39.0–52.0)
Hemoglobin: 16.5 g/dL (ref 13.0–17.0)
Lymphocytes Relative: 9 % — ABNORMAL LOW (ref 12–46)
Lymphs Abs: 0.9 10*3/uL (ref 0.7–4.0)
MCH: 28.6 pg (ref 26.0–34.0)
MCHC: 34.5 g/dL (ref 30.0–36.0)
MCV: 82.8 fL (ref 78.0–100.0)
MONOS PCT: 9 % (ref 3–12)
Monocytes Absolute: 0.8 10*3/uL (ref 0.1–1.0)
NEUTROS ABS: 7.8 10*3/uL — AB (ref 1.7–7.7)
NEUTROS PCT: 82 % — AB (ref 43–77)
Platelets: 100 10*3/uL — ABNORMAL LOW (ref 150–400)
RBC: 5.77 MIL/uL (ref 4.22–5.81)
RDW: 17.2 % — ABNORMAL HIGH (ref 11.5–15.5)
WBC: 9.5 10*3/uL (ref 4.0–10.5)

## 2013-12-10 LAB — COMPREHENSIVE METABOLIC PANEL
ALK PHOS: 87 U/L (ref 39–117)
ALT: 29 U/L (ref 0–53)
AST: 23 U/L (ref 0–37)
Albumin: 3.8 g/dL (ref 3.5–5.2)
Anion gap: 18 — ABNORMAL HIGH (ref 5–15)
BUN: 13 mg/dL (ref 6–23)
CHLORIDE: 95 meq/L — AB (ref 96–112)
CO2: 22 mEq/L (ref 19–32)
Calcium: 9.2 mg/dL (ref 8.4–10.5)
Creatinine, Ser: 1.26 mg/dL (ref 0.50–1.35)
GFR calc Af Amer: 62 mL/min — ABNORMAL LOW (ref >90)
GFR calc non Af Amer: 53 mL/min — ABNORMAL LOW (ref >90)
Glucose, Bld: 301 mg/dL — ABNORMAL HIGH (ref 70–99)
POTASSIUM: 4.1 meq/L (ref 3.7–5.3)
Sodium: 135 mEq/L — ABNORMAL LOW (ref 137–147)
Total Bilirubin: 2.6 mg/dL — ABNORMAL HIGH (ref 0.3–1.2)
Total Protein: 7.8 g/dL (ref 6.0–8.3)

## 2013-12-10 LAB — CREATININE, SERUM
Creatinine, Ser: 1.15 mg/dL (ref 0.50–1.35)
GFR, EST AFRICAN AMERICAN: 69 mL/min — AB (ref >90)
GFR, EST NON AFRICAN AMERICAN: 60 mL/min — AB (ref >90)

## 2013-12-10 LAB — TROPONIN I: Troponin I: <0.3 ng/mL (ref <0.30)

## 2013-12-10 LAB — URINE MICROSCOPIC-ADD ON

## 2013-12-10 LAB — PRO B NATRIURETIC PEPTIDE: PRO B NATRI PEPTIDE: 6470 pg/mL — AB (ref 0–450)

## 2013-12-10 LAB — CBG MONITORING, ED: Glucose-Capillary: 256 mg/dL — ABNORMAL HIGH (ref 70–99)

## 2013-12-10 LAB — TSH: TSH: 0.93 u[IU]/mL (ref 0.350–4.500)

## 2013-12-10 LAB — MRSA PCR SCREENING: MRSA by PCR: NEGATIVE

## 2013-12-10 LAB — D-DIMER, QUANTITATIVE: D-Dimer, Quant: 1.36 ug/mL-FEU — ABNORMAL HIGH (ref 0.00–0.48)

## 2013-12-10 LAB — GLUCOSE, CAPILLARY: Glucose-Capillary: 198 mg/dL — ABNORMAL HIGH (ref 70–99)

## 2013-12-10 MED ORDER — ASPIRIN EC 81 MG PO TBEC
81.0000 mg | DELAYED_RELEASE_TABLET | Freq: Every day | ORAL | Status: DC
  Administered 2013-12-10 – 2013-12-14 (×5): 81 mg via ORAL
  Filled 2013-12-10 (×5): qty 1

## 2013-12-10 MED ORDER — INSULIN ASPART 100 UNIT/ML ~~LOC~~ SOLN
0.0000 [IU] | Freq: Three times a day (TID) | SUBCUTANEOUS | Status: DC
  Administered 2013-12-10: 8 [IU] via SUBCUTANEOUS
  Administered 2013-12-11: 15 [IU] via SUBCUTANEOUS
  Administered 2013-12-11: 11 [IU] via SUBCUTANEOUS
  Administered 2013-12-12: 10 [IU] via SUBCUTANEOUS
  Administered 2013-12-12 – 2013-12-13 (×3): 11 [IU] via SUBCUTANEOUS
  Administered 2013-12-13: 5 [IU] via SUBCUTANEOUS
  Filled 2013-12-10: qty 1

## 2013-12-10 MED ORDER — ONDANSETRON HCL 4 MG/2ML IJ SOLN: 4.0000 mg | Freq: Four times a day (QID) | INTRAMUSCULAR | Status: DC | PRN

## 2013-12-10 MED ORDER — DEXTROSE 5 % IV SOLN
500.0000 mg | Freq: Once | INTRAVENOUS | Status: AC
  Administered 2013-12-10: 500 mg via INTRAVENOUS
  Filled 2013-12-10: qty 500

## 2013-12-10 MED ORDER — GLIPIZIDE 10 MG PO TABS
10.0000 mg | ORAL_TABLET | Freq: Two times a day (BID) | ORAL | Status: DC
  Administered 2013-12-11 – 2013-12-14 (×7): 10 mg via ORAL
  Filled 2013-12-10 (×9): qty 1

## 2013-12-10 MED ORDER — SODIUM CHLORIDE 0.9 % IJ SOLN
3.0000 mL | Freq: Two times a day (BID) | INTRAMUSCULAR | Status: DC
Start: 2013-12-10 — End: 2013-12-14
  Administered 2013-12-11 – 2013-12-14 (×6): 3 mL via INTRAVENOUS

## 2013-12-10 MED ORDER — ONDANSETRON HCL 4 MG PO TABS: 4.0000 mg | ORAL_TABLET | Freq: Four times a day (QID) | ORAL | Status: DC | PRN

## 2013-12-10 MED ORDER — DEXTROSE 5 % IV SOLN
500.0000 mg | INTRAVENOUS | Status: DC
  Administered 2013-12-11 – 2013-12-12 (×2): 500 mg via INTRAVENOUS
  Filled 2013-12-10 (×3): qty 500

## 2013-12-10 MED ORDER — DEXTROSE 5 % IV SOLN
1.0000 g | INTRAVENOUS | Status: DC
  Administered 2013-12-11 – 2013-12-13 (×3): 1 g via INTRAVENOUS
  Filled 2013-12-10 (×4): qty 10

## 2013-12-10 MED ORDER — ENOXAPARIN SODIUM 40 MG/0.4ML ~~LOC~~ SOLN
40.0000 mg | SUBCUTANEOUS | Status: DC
  Administered 2013-12-10: 40 mg via SUBCUTANEOUS
  Filled 2013-12-10 (×2): qty 0.4

## 2013-12-10 MED ORDER — ALBUTEROL SULFATE (2.5 MG/3ML) 0.083% IN NEBU: 2.5000 mg | INHALATION_SOLUTION | Freq: Four times a day (QID) | RESPIRATORY_TRACT | Status: DC | PRN

## 2013-12-10 MED ORDER — CARVEDILOL 12.5 MG PO TABS
12.5000 mg | ORAL_TABLET | Freq: Two times a day (BID) | ORAL | Status: DC
  Administered 2013-12-11 – 2013-12-14 (×7): 12.5 mg via ORAL
  Filled 2013-12-10 (×9): qty 1

## 2013-12-10 MED ORDER — ALBUTEROL SULFATE HFA 108 (90 BASE) MCG/ACT IN AERS
2.0000 | INHALATION_SPRAY | Freq: Four times a day (QID) | RESPIRATORY_TRACT | Status: DC | PRN
Start: 2013-12-10 — End: 2013-12-10

## 2013-12-10 MED ORDER — TIOTROPIUM BROMIDE MONOHYDRATE 18 MCG IN CAPS
18.0000 ug | ORAL_CAPSULE | Freq: Every day | RESPIRATORY_TRACT | Status: DC
  Administered 2013-12-11 – 2013-12-14 (×3): 18 ug via RESPIRATORY_TRACT
  Filled 2013-12-10: qty 5

## 2013-12-10 MED ORDER — ACETAMINOPHEN 325 MG PO TABS: 650.0000 mg | ORAL_TABLET | Freq: Four times a day (QID) | ORAL | Status: DC | PRN

## 2013-12-10 MED ORDER — ASPIRIN 81 MG PO TABS: 81.0000 mg | ORAL_TABLET | Freq: Every day | ORAL | Status: DC

## 2013-12-10 MED ORDER — SODIUM CHLORIDE 0.9 % IV SOLN
INTRAVENOUS | Status: AC
Start: 2013-12-10 — End: 2013-12-11

## 2013-12-10 MED ORDER — LEVALBUTEROL HCL 0.63 MG/3ML IN NEBU
0.6300 mg | INHALATION_SOLUTION | Freq: Four times a day (QID) | RESPIRATORY_TRACT | Status: DC
  Administered 2013-12-10 – 2013-12-12 (×10): 0.63 mg via RESPIRATORY_TRACT
  Filled 2013-12-10 (×21): qty 3

## 2013-12-10 MED ORDER — METHYLPREDNISOLONE SODIUM SUCC 125 MG IJ SOLR
60.0000 mg | Freq: Two times a day (BID) | INTRAMUSCULAR | Status: DC
  Administered 2013-12-10 – 2013-12-13 (×6): 60 mg via INTRAVENOUS
  Filled 2013-12-10 (×5): qty 0.96
  Filled 2013-12-10: qty 2
  Filled 2013-12-10 (×2): qty 0.96
  Filled 2013-12-10: qty 2

## 2013-12-10 MED ORDER — SODIUM CHLORIDE 0.9 % IV BOLUS (SEPSIS)
500.0000 mL | Freq: Once | INTRAVENOUS | Status: AC
  Administered 2013-12-10: 500 mL via INTRAVENOUS

## 2013-12-10 MED ORDER — ACETAMINOPHEN 650 MG RE SUPP: 650.0000 mg | Freq: Four times a day (QID) | RECTAL | Status: DC | PRN

## 2013-12-10 MED ORDER — FUROSEMIDE 10 MG/ML IJ SOLN
40.0000 mg | Freq: Once | INTRAMUSCULAR | Status: AC
  Administered 2013-12-10: 40 mg via INTRAVENOUS
  Filled 2013-12-10: qty 4

## 2013-12-10 MED ORDER — IOHEXOL 350 MG/ML SOLN
80.0000 mL | Freq: Once | INTRAVENOUS | Status: AC | PRN
  Administered 2013-12-10: 80 mL via INTRAVENOUS

## 2013-12-10 MED ORDER — SIMVASTATIN 20 MG PO TABS
20.0000 mg | ORAL_TABLET | Freq: Every day | ORAL | Status: DC
  Administered 2013-12-10 – 2013-12-13 (×4): 20 mg via ORAL
  Filled 2013-12-10 (×5): qty 1

## 2013-12-10 MED ORDER — HYDRALAZINE HCL 20 MG/ML IJ SOLN: 10.0000 mg | Freq: Four times a day (QID) | INTRAMUSCULAR | Status: DC | PRN

## 2013-12-10 MED ORDER — CEFTRIAXONE SODIUM 1 G IJ SOLR
1.0000 g | Freq: Once | INTRAMUSCULAR | Status: AC
  Administered 2013-12-10: 1 g via INTRAVENOUS
  Filled 2013-12-10: qty 10

## 2013-12-10 NOTE — ED Notes (Signed)
Patient transported to CT 

## 2013-12-10 NOTE — ED Notes (Signed)
Patients saturations 88% on 4L, Respiratory notified to come assess patient.

## 2013-12-10 NOTE — H&P (Signed)
[facility] History and Physical  Jared Santos XIP:[id number] DOB: 1935-02-11 DOA: 12/10/2013  Referring physician:   PCP: Maggie Font, MD   Chief Complaint: Shortness of breath   HPI:  79 year old male with a history of diabetes, coronary artery disease, status post PCI and CABG, atrial fibrillation, COPD, hypertension presented to the ER with shortness of breath and dysuria. Shortness of breath started one week ago it is associated with pleuritic chest pain. Patient was found to be percent on room air 88% on 4 L of oxygen. He has had a productive cough with brown sputum and also has experienced some worsening dependent edema in the last one week. He denies any weight gain or weight loss. He denies any subjective fevers. He denies any nausea vomiting or diarrhea. He wears 3 L of oxygen at night. Was found to have a urinary tract infection, pro BNP 6470, troponin negative Is currently on 50% FiO2 Ventimask and is being admitted to step down Chest x-ray shows right upper lobe airspace disease   Review of Systems: negative for the following  Constitutional: Denies fever, chills, diaphoresis, appetite change and fatigue.  HEENT: Denies photophobia, eye pain, redness, hearing loss, ear pain, congestion, sore throat, rhinorrhea, sneezing, mouth sores, trouble swallowing, neck pain, neck stiffness and tinnitus.  Respiratory: Positive for cough and shortness of breath.  Cardiovascular: Positive for leg swelling. Negative for chest pain.  Gastrointestinal: Negative for nausea, vomiting, abdominal pain and diarrhea.  Genitourinary: Positive for frequency. Negative for dysuria and hematuria  Musculoskeletal: Denies myalgias, back pain, joint swelling, arthralgias and gait problem.  Skin: Denies pallor, rash and wound.  Neurological: Denies dizziness, seizures, syncope, weakness, light-headedness, numbness and headaches.  Hematological: Denies adenopathy. Easy bruising, personal or family  bleeding history  Psychiatric/Behavioral: Denies suicidal ideation, mood changes, confusion, nervousness, sleep disturbance and agitation       Past Medical History  Diagnosis Date  . Cardiomyopathy secondary     LVEF 60-65% 9/12, LVEF 43% by Myoview 11/12  . Coronary atherosclerosis of native coronary artery     s/p MI in 2005, s/p DES to RCA in 2005;  s/p CABG in 3/09: L-LAD, S-Dx, S-OM, S-dRCA/PDA  . Type 2 diabetes mellitus   . Atrial flutter     s/p RFCA 2005  . Essential hypertension, benign   . Cerebral aneurysm     s/p clipping   . Mixed hyperlipidemia   . Atrial fibrillation   . Carotid stenosis     h/o occluded RICA  . COPD (chronic obstructive pulmonary disease)   . Lung nodule   . History of tuberculosis     1971  . Diabetes mellitus   . OSA (obstructive sleep apnea) 01/01/2011  . Hypoxemia 03/13/2011     Past Surgical History  Procedure Laterality Date  . Coronary artery bypass graft  2005    LIMA to LAD, SVG to diagonal, SVG to OM, SVG to RCA and PDA      Social History:  reports that he quit smoking about 27 years ago. His smoking use included Cigarettes. He has a 35 pack-year smoking history. He has never used smokeless tobacco. He reports that he does not drink alcohol or use illicit drugs.    No Known Allergies  Family History  Problem Relation Age of Onset  . Diabetes Mother   . Hypertension    . Arthritis Mother   . Tuberculosis Brother   . Tuberculosis Sister      Prior to Admission medications  Medication Sig Start Date End Date Taking? Authorizing Provider  acetaminophen (TYLENOL) 500 MG tablet Take 500 mg by mouth every 4 (four) hours as needed.      Historical Provider, MD  albuterol (VENTOLIN HFA) 108 (90 BASE) MCG/ACT inhaler Inhale 2 puffs into the lungs every 6 (six) hours as needed for wheezing. 10/18/12 10/18/13  Chesley Mires, MD  aspirin 81 MG tablet Take 81 mg by mouth daily.    Historical Provider, MD  carvedilol (COREG) 12.5  MG tablet Take 1 tablet (12.5 mg total) by mouth 2 (two) times daily with a meal. 11/28/13 11/28/14  Satira Sark, MD  furosemide (LASIX) 20 MG tablet Take 1 tablet (20 mg total) by mouth 2 (two) times daily. 07/11/13 07/11/14  Satira Sark, MD  glipiZIDE (GLUCOTROL) 10 MG tablet Take 10 mg by mouth 2 (two) times daily before a meal.      Historical Provider, MD  isosorbide mononitrate (IMDUR) 30 MG 24 hr tablet take 1 tablet by mouth once daily 04/17/13   Satira Sark, MD  metFORMIN (GLUCOPHAGE) 1000 MG tablet Take 1,000 mg by mouth 2 (two) times daily with a meal.      Historical Provider, MD  ranitidine (ZANTAC) 150 MG capsule Take 150 mg by mouth as needed.     Historical Provider, MD  simvastatin (ZOCOR) 20 MG tablet Take 1 tablet (20 mg total) by mouth at bedtime. 05/10/13   Satira Sark, MD  tiotropium (SPIRIVA HANDIHALER) 18 MCG inhalation capsule Place 1 capsule (18 mcg total) into inhaler and inhale daily. 05/20/13   Chesley Mires, MD     Physical Exam: Filed Vitals:   12/10/13 1615 12/10/13 1630 12/10/13 1645 12/10/13 1656  BP: 147/64     Pulse: 94 83 97 86  Temp:      TempSrc:      Resp: 18 15 33 22  SpO2: 91% 84% 90% 93%     Constitutional: Vital signs reviewed. Patient is a well-developed and well-nourished in no acute distress and cooperative with exam. Alert and oriented x3.  Head: Normocephalic and atraumatic  Ear: TM normal bilaterally  Mouth: no erythema or exudates, MMM  Eyes: PERRL, EOMI, conjunctivae normal, No scleral icterus.  Neck: Supple, Trachea midline normal ROM, No JVD, mass, thyromegaly, or carotid bruit present.  Cardiovascular: RRR, S1 normal, S2 normal, no MRG, pulses symmetric and intact bilaterally  Pulmonary/Chest: CTAB, no wheezes, rales, or rhonchi  Abdominal: Soft. Non-tender, non-distended, bowel sounds are normal, no masses, organomegaly, or guarding present.  GU: no CVA tenderness Musculoskeletal: No joint deformities, erythema, or  stiffness, edema (mild pitting edema in the distal bilateral lower extremities Ext: no edema and no cyanosis, pulses palpable bilaterally (DP and PT)  Hematology: no cervical, inginal, or axillary adenopathy.  Neurological: A&O x3, Strenght is normal and symmetric bilaterally, cranial nerve II-XII are grossly intact, no focal motor deficit, sensory intact to light touch bilaterally.  Skin: Warm, dry and intact. No rash, cyanosis, or clubbing.  Psychiatric: Normal mood and affect. speech and behavior is normal. Judgment and thought content normal. Cognition and memory are normal.       Labs on Admission:    Basic Metabolic Panel:  Recent Labs Lab 12/10/13 1320 12/10/13 1640  NA 135*  --   K 4.1  --   CL 95*  --   CO2 22  --   GLUCOSE 301*  --   BUN 13  --   CREATININE 1.26 1.15  CALCIUM 9.2  --   MG  --  1.5   Liver Function Tests:  Recent Labs Lab 12/10/13 1320 12/10/13 1640  AST 23 22  ALT 29 25  ALKPHOS 87 78  BILITOT 2.6* 2.4*  PROT 7.8 7.5  ALBUMIN 3.8 3.5   No results found for this basename: LIPASE, AMYLASE,  in the last 168 hours No results found for this basename: AMMONIA,  in the last 168 hours CBC:  Recent Labs Lab 12/10/13 1320 12/10/13 1640  WBC 9.5 10.3  NEUTROABS 7.8*  --   HGB 16.5 15.9  HCT 47.8 46.2  MCV 82.8 82.5  PLT 100* 96*   Cardiac Enzymes:  Recent Labs Lab 12/10/13 1320 12/10/13 1640  TROPONINI <0.30 <0.30    BNP (last 3 results)  Recent Labs  12/10/13 1320  PROBNP 6470.0*      CBG: No results found for this basename: GLUCAP,  in the last 168 hours  Radiological Exams on Admission: Dg Chest 2 View  12/10/2013   CLINICAL DATA:  Short of breath. Cough. Prior CABG. Hypertension. Diabetes.  EXAM: CHEST  2 VIEW  COMPARISON:  12/24/2010  FINDINGS: Lateral view degraded by patient arm position. Prior median sternotomy. Midline trachea. Mild cardiomegaly with a tortuous thoracic aorta. No pleural effusion or  pneumothorax. subtle right upper lobe airspace disease is suspected laterally. Most apparent on the frontal radiograph.  IMPRESSION: Suspicion of right upper lobe airspace disease/pneumonia. This is subtle and warrants followup.  Cardiomegaly without congestive failure.   Electronically Signed   By: Abigail Miyamoto M.D.   On: 12/10/2013 14:09    EKG: Independently reviewed. EKG shows sinus rhythm with right bundle branch block Assessment/Plan Active Problems:   CAP (community acquired pneumonia)   Acute on chronic Hypoxemic respiratory failure History of COPD, systolic heart failure I feel this is a combination of both COPD exacerbation secondary to pneumonia as well as acute on chronic systolic heart failure. Patient does have a previous right lower lobe scar, and a previous 1.4 cm nodule or scar in the anterior right upper lobe, previous PET scans have not revealed any increase activity in the right upper lobe CT scan repeated on 4/13 showed resolution of the right upper lobe nodule, and centrilobular emphysema Oxygen Dependent at home Last 2-D echo  05/12/12 >> EF 55%, mod LA dilation, mild/mod RA dilation, PAS 62 mmHg  Sleep study 10/19/12 >> AHI 0.2, used 3 liters oxygen  Patient will be admitted to step down We'll obtain ABG Empiric antibiotics, IV steroids, blood culture x2, d-dimer Would likely need a repeat CT scan to rule out PE/pneumonia Nebs, Spiriva   Urinary tract infection Patient on Rocephin Obtain urine culture    History of Cardiomyopathy-patient should be initiated on gentle diuresis following his CT scan of his kidney function is stable, I feel that IV Lasix as not indicated at this time and I don't hear any crackles, no significant dependent edema noted upon my exam  Coronary artery disease-we'll cycle cardiac enzymes continue Coreg  Type 2 diabetes-sliding scale insulin, continue glipizide, hold metformin, hemoglobin A1c  Essential hypertension-continue  Coreg  Hyperlipidemia-continue Zocor     Code Status:   full Family Communication: bedside Disposition Plan: admit   Time spent: 70 mins   [facility] Pager [phone]  If 7PM-7AM, please contact night-coverage [email or web address] Password [email or web address] 12/10/2013, 5:29 PM

## 2013-12-10 NOTE — ED Notes (Signed)
Showed dr. Johnney Killian EKG.

## 2013-12-10 NOTE — ED Notes (Signed)
Patient returned from CT

## 2013-12-10 NOTE — ED Notes (Signed)
Reports having sob that started last week and then began having urinary incontinence last night. ekg done. spo2 82% at triage.

## 2013-12-10 NOTE — ED Notes (Signed)
Admit MD at bedside

## 2013-12-10 NOTE — ED Provider Notes (Signed)
CSN: [id number]     Arrival date & time 12/10/13  1228 History   First MD Initiated Contact with Patient 12/10/13 1259     Chief Complaint  Patient presents with  . Shortness of Breath  . Urinary Incontinence     (Consider location/radiation/quality/duration/timing/severity/associated sxs/prior Treatment) Patient is a 79 y.o. male presenting with shortness of breath. The history is provided by the patient.  Shortness of Breath Severity:  Mild Onset quality:  Gradual Duration:  1 week Timing:  Constant Progression:  Worsening Chronicity:  New Context comment:  At rest Relieved by:  Nothing Worsened by:  Activity Ineffective treatments:  None tried Associated symptoms: cough   Associated symptoms: no abdominal pain, no chest pain, no fever, no headaches, no neck pain and no vomiting   Cough:    Cough characteristics:  Productive   Sputum characteristics:  [place]   Severity:  Mild   Onset quality:  Gradual   Duration:  1 week   Frequency: worsening.   Past Medical History  Diagnosis Date  . Cardiomyopathy secondary     LVEF 60-65% 9/12, LVEF 43% by Myoview 11/12  . Coronary atherosclerosis of native coronary artery     s/p MI in 2005, s/p DES to RCA in 2005;  s/p CABG in 3/09: L-LAD, S-Dx, S-OM, S-dRCA/PDA  . Type 2 diabetes mellitus   . Atrial flutter     s/p RFCA 2005  . Essential hypertension, benign   . Cerebral aneurysm     s/p clipping   . Mixed hyperlipidemia   . Atrial fibrillation   . Carotid stenosis     h/o occluded RICA  . COPD (chronic obstructive pulmonary disease)   . Lung nodule   . History of tuberculosis     1971  . Diabetes mellitus   . OSA (obstructive sleep apnea) 01/01/2011  . Hypoxemia 03/13/2011   Past Surgical History  Procedure Laterality Date  . Coronary artery bypass graft  2005    LIMA to LAD, SVG to diagonal, SVG to OM, SVG to RCA and PDA   Family History  Problem Relation Age of Onset  . Diabetes Mother   . Hypertension    .  Arthritis Mother   . Tuberculosis Brother   . Tuberculosis Sister    History  Substance Use Topics  . Smoking status: Former Smoker -- 1.00 packs/day for 35 years    Types: Cigarettes    Quit date: 04/07/1986  . Smokeless tobacco: Never Used  . Alcohol Use: No    Review of Systems  Constitutional: Negative for fever.  HENT: Negative for drooling and rhinorrhea.   Eyes: Negative for pain.  Respiratory: Positive for cough and shortness of breath.   Cardiovascular: Positive for leg swelling. Negative for chest pain.  Gastrointestinal: Negative for nausea, vomiting, abdominal pain and diarrhea.  Genitourinary: Positive for frequency. Negative for dysuria and hematuria.  Musculoskeletal: Negative for gait problem and neck pain.  Skin: Negative for color change.  Neurological: Negative for numbness and headaches.  Hematological: Negative for adenopathy.  Psychiatric/Behavioral: Negative for behavioral problems.  All other systems reviewed and are negative.     Allergies  Review of patient's allergies indicates no known allergies.  Home Medications   Prior to Admission medications   Medication Sig Start Date End Date Taking? Authorizing Provider  acetaminophen (TYLENOL) 500 MG tablet Take 500 mg by mouth every 4 (four) hours as needed.      Historical Provider, MD  albuterol (VENTOLIN HFA)  108 (90 BASE) MCG/ACT inhaler Inhale 2 puffs into the lungs every 6 (six) hours as needed for wheezing. 10/18/12 10/18/13  Chesley Mires, MD  aspirin 81 MG tablet Take 81 mg by mouth daily.    Historical Provider, MD  carvedilol (COREG) 12.5 MG tablet Take 1 tablet (12.5 mg total) by mouth 2 (two) times daily with a meal. 11/28/13 11/28/14  Satira Sark, MD  furosemide (LASIX) 20 MG tablet Take 1 tablet (20 mg total) by mouth 2 (two) times daily. 07/11/13 07/11/14  Satira Sark, MD  glipiZIDE (GLUCOTROL) 10 MG tablet Take 10 mg by mouth 2 (two) times daily before a meal.      Historical  Provider, MD  isosorbide mononitrate (IMDUR) 30 MG 24 hr tablet take 1 tablet by mouth once daily 04/17/13   Satira Sark, MD  metFORMIN (GLUCOPHAGE) 1000 MG tablet Take 1,000 mg by mouth 2 (two) times daily with a meal.      Historical Provider, MD  ranitidine (ZANTAC) 150 MG capsule Take 150 mg by mouth as needed.     Historical Provider, MD  simvastatin (ZOCOR) 20 MG tablet Take 1 tablet (20 mg total) by mouth at bedtime. 05/10/13   Satira Sark, MD  tiotropium (SPIRIVA HANDIHALER) 18 MCG inhalation capsule Place 1 capsule (18 mcg total) into inhaler and inhale daily. 05/20/13   Chesley Mires, MD   BP 158/65  Pulse 93  Temp(Src) 99.2 F (37.3 C) (Oral)  Resp 18  SpO2 82% Physical Exam  Nursing note and vitals reviewed. Constitutional: He is oriented to person, place, and time. He appears well-developed and well-nourished.  HENT:  Head: Normocephalic and atraumatic.  Right Ear: External ear normal.  Left Ear: External ear normal.  Nose: Nose normal.  Mouth/Throat: Oropharynx is clear and moist. No oropharyngeal exudate.  Eyes: Conjunctivae and EOM are normal. Pupils are equal, round, and reactive to light.  Neck: Normal range of motion. Neck supple.  Cardiovascular: Normal rate, regular rhythm, normal heart sounds and intact distal pulses.  Exam reveals no gallop and no friction rub.   No murmur heard. Pulmonary/Chest: Effort normal and breath sounds normal. No respiratory distress. He has no wheezes.  Abdominal: Soft. Bowel sounds are normal. He exhibits no distension. There is no tenderness. There is no rebound and no guarding.  Musculoskeletal: Normal range of motion. He exhibits edema (mild pitting edema in the distal bilateral lower extremities.). He exhibits no tenderness.  Neurological: He is alert and oriented to person, place, and time.  Skin: Skin is warm and dry.  Psychiatric: He has a normal mood and affect. His behavior is normal.    ED Course  Procedures  (including critical care time) Labs Review Labs Reviewed  PRO B NATRIURETIC PEPTIDE - Abnormal; Notable for the following:    Pro B Natriuretic peptide (BNP) 6470.0 (*)    All other components within normal limits  URINALYSIS, ROUTINE W REFLEX MICROSCOPIC - Abnormal; Notable for the following:    Color, Urine AMBER (*)    Glucose, UA >1000 (*)    Hgb urine dipstick LARGE (*)    Protein, ur 100 (*)    Nitrite POSITIVE (*)    Leukocytes, UA SMALL (*)    All other components within normal limits  CBC WITH DIFFERENTIAL - Abnormal; Notable for the following:    RDW 17.2 (*)    Platelets 100 (*)    Neutrophils Relative % 82 (*)    Neutro Abs 7.8 (*)  Lymphocytes Relative 9 (*)    All other components within normal limits  COMPREHENSIVE METABOLIC PANEL - Abnormal; Notable for the following:    Sodium 135 (*)    Chloride 95 (*)    Glucose, Bld 301 (*)    Total Bilirubin 2.6 (*)    GFR calc non Af Amer 53 (*)    GFR calc Af Amer 62 (*)    Anion gap 18 (*)    All other components within normal limits  URINE MICROSCOPIC-ADD ON - Abnormal; Notable for the following:    Bacteria, UA MANY (*)    All other components within normal limits  CBC - Abnormal; Notable for the following:    RDW 17.2 (*)    Platelets 96 (*)    All other components within normal limits  CREATININE, SERUM - Abnormal; Notable for the following:    GFR calc non Af Amer 60 (*)    GFR calc Af Amer 69 (*)    All other components within normal limits  HEPATIC FUNCTION PANEL - Abnormal; Notable for the following:    Total Bilirubin 2.4 (*)    Indirect Bilirubin 2.1 (*)    All other components within normal limits  COMPREHENSIVE METABOLIC PANEL - Abnormal; Notable for the following:    Sodium 133 (*)    Glucose, Bld 260 (*)    Albumin 3.0 (*)    Total Bilirubin 2.3 (*)    GFR calc non Af Amer 60 (*)    GFR calc Af Amer 70 (*)    All other components within normal limits  CBC - Abnormal; Notable for the  following:    WBC 11.2 (*)    RDW 17.1 (*)    Platelets 85 (*)    All other components within normal limits  PRO B NATRIURETIC PEPTIDE - Abnormal; Notable for the following:    Pro B Natriuretic peptide (BNP) 7375.0 (*)    All other components within normal limits  D-DIMER, QUANTITATIVE - Abnormal; Notable for the following:    D-Dimer, Quant 1.36 (*)    All other components within normal limits  GLUCOSE, CAPILLARY - Abnormal; Notable for the following:    Glucose-Capillary 198 (*)    All other components within normal limits  CBG MONITORING, ED - Abnormal; Notable for the following:    Glucose-Capillary 256 (*)    All other components within normal limits  MRSA PCR SCREENING  TROPONIN I  MAGNESIUM  TSH  TROPONIN I  TROPONIN I  TROPONIN I  HEMOGLOBIN A1C    Imaging Review Dg Chest 2 View  12/10/2013   CLINICAL DATA:  Short of breath. Cough. Prior CABG. Hypertension. Diabetes.  EXAM: CHEST  2 VIEW  COMPARISON:  12/24/2010  FINDINGS: Lateral view degraded by patient arm position. Prior median sternotomy. Midline trachea. Mild cardiomegaly with a tortuous thoracic aorta. No pleural effusion or pneumothorax. subtle right upper lobe airspace disease is suspected laterally. Most apparent on the frontal radiograph.  IMPRESSION: Suspicion of right upper lobe airspace disease/pneumonia. This is subtle and warrants followup.  Cardiomegaly without congestive failure.   Electronically Signed   By: Abigail Miyamoto M.D.   On: 12/10/2013 14:09   Ct Angio Chest Pe W/cm &/or Wo Cm  12/10/2013   ADDENDUM REPORT: 12/10/2013 20:14  ADDENDUM: Mild enlargement of the main pulmonary artery, which can be seen in the setting of pulmonary arterial hypertension.   Electronically Signed   By: Logan Bores   On: 12/10/2013 20:14  12/10/2013   CLINICAL DATA:  Shortness of breath and decreased oxygen saturation. Rule out malignancy or pulmonary embolism.  EXAM: CT ANGIOGRAPHY CHEST WITH CONTRAST  TECHNIQUE:  Multidetector CT imaging of the chest was performed using the standard protocol during bolus administration of intravenous contrast. Multiplanar CT image reconstructions and MIPs were obtained to evaluate the vascular anatomy.  CONTRAST:  59mL OMNIPAQUE IOHEXOL 350 MG/ML SOLN  COMPARISON:  Chest radiographs earlier today.  Chest CT 09/16/2011.  FINDINGS: The main pulmonary artery is mildly enlarged, measuring 3.5 cm, similar to the prior CT. No filling defects suggestive of pulmonary emboli are identified. Extensive atherosclerotic calcification is noted involving the thoracic aorta and coronary arteries. Heart is mildly enlarged. Sequelae of prior CABG are identified.  No enlarged axillary, mediastinal, or hilar lymph nodes are identified. No pleural or pericardial effusion is present. Evaluation of the lung parenchyma is mildly limited by respiratory motion artifact. There is centrilobular emphysema and biapical scarring. There is mild dependent subsegmental atelectasis involving the right greater than left lower lobes and posterior right upper lobe. No confluent opacity suggestive of pneumonia is identified. Mild right lower lobe scarring is again seen. Airways are patent.  The visualized portion of the upper abdomen is unremarkable. 2.5 cm lipoma is again seen in the right subscapularis muscle. Mild-to-moderate thoracic spondylosis is noted.  Review of the MIP images confirms the above findings.  IMPRESSION: 1. No evidence of acute pulmonary emboli. 2. Centrilobular emphysema.  No mass.  Electronically Signed: By: Logan Bores On: 12/10/2013 18:47     EKG Interpretation   Date/Time:  Saturday December 10 2013 13:13:14 EDT Ventricular Rate:  87 PR Interval:  184 QRS Duration: 168 QT Interval:  420 QTC Calculation: 505 R Axis:   -163 Text Interpretation:  Sinus rhythm RBBB and LPFB Anterior infarct, old no  significant change  Confirmed by Dorell Gatlin  MD, [place] ([id number]) on 12/10/2013  1:22:57 PM       MDM   Final diagnoses:  CAP (community acquired pneumonia)  UTI (lower urinary tract infection)  Hypoxia    1:05 PM 79 y.o. male w hx of DM, CAD s/p PCI and CABG, afib, copd, HTN who pw urinary frequency since yesterday. He also complains of a worsening productive cough with brown sputum and gradually worsening shortness of breath for one week. He denies any fevers, vomiting, or diarrhea. He denies any chest pain. He states that he wears 3 L of oxygen at night but does not wear any oxygen during the day. His O2 sat is 72% on room air. He is otherwise afebrile and vital signs are unremarkable. Will get screening labs and imaging.  CXR suspicious for PNA and UA c/w UTI. Will tx w/ rocephin/azithro. Pt stable on venti mask at 40% O2. Will admit to hospitalist.     Pamella Pert, MD 12/11/13 [phone]

## 2013-12-10 NOTE — Progress Notes (Signed)
Pt arrived on unit at approximately 1850.  CHG bath given upon arrival on floor.  Pt is alert and oriented, resting comfortably in bed.

## 2013-12-11 DIAGNOSIS — I369 Nonrheumatic tricuspid valve disorder, unspecified: Secondary | ICD-10-CM

## 2013-12-11 LAB — COMPREHENSIVE METABOLIC PANEL
ALK PHOS: 68 U/L (ref 39–117)
ALT: 21 U/L (ref 0–53)
AST: 17 U/L (ref 0–37)
Albumin: 3 g/dL — ABNORMAL LOW (ref 3.5–5.2)
Anion gap: 14 (ref 5–15)
BILIRUBIN TOTAL: 2.3 mg/dL — AB (ref 0.3–1.2)
BUN: 18 mg/dL (ref 6–23)
CHLORIDE: 97 meq/L (ref 96–112)
CO2: 22 mEq/L (ref 19–32)
Calcium: 8.4 mg/dL (ref 8.4–10.5)
Creatinine, Ser: 1.14 mg/dL (ref 0.50–1.35)
GFR, EST AFRICAN AMERICAN: 70 mL/min — AB (ref >90)
GFR, EST NON AFRICAN AMERICAN: 60 mL/min — AB (ref >90)
GLUCOSE: 260 mg/dL — AB (ref 70–99)
POTASSIUM: 3.8 meq/L (ref 3.7–5.3)
Sodium: 133 mEq/L — ABNORMAL LOW (ref 137–147)
Total Protein: 6.5 g/dL (ref 6.0–8.3)

## 2013-12-11 LAB — HEMOGLOBIN A1C
Hgb A1c MFr Bld: 8.3 % — ABNORMAL HIGH (ref <5.7)
MEAN PLASMA GLUCOSE: 192 mg/dL — AB (ref <117)

## 2013-12-11 LAB — CBC
HCT: 41.9 % (ref 39.0–52.0)
Hemoglobin: 14.3 g/dL (ref 13.0–17.0)
MCH: 28.1 pg (ref 26.0–34.0)
MCHC: 34.1 g/dL (ref 30.0–36.0)
MCV: 82.5 fL (ref 78.0–100.0)
PLATELETS: 85 10*3/uL — AB (ref 150–400)
RBC: 5.08 MIL/uL (ref 4.22–5.81)
RDW: 17.1 % — ABNORMAL HIGH (ref 11.5–15.5)
WBC: 11.2 10*3/uL — ABNORMAL HIGH (ref 4.0–10.5)

## 2013-12-11 LAB — GLUCOSE, CAPILLARY
Glucose-Capillary: 293 mg/dL — ABNORMAL HIGH (ref 70–99)
Glucose-Capillary: 350 mg/dL — ABNORMAL HIGH (ref 70–99)
Glucose-Capillary: 394 mg/dL — ABNORMAL HIGH (ref 70–99)

## 2013-12-11 LAB — TROPONIN I: Troponin I: <0.3 ng/mL (ref <0.30)

## 2013-12-11 LAB — PRO B NATRIURETIC PEPTIDE: PRO B NATRI PEPTIDE: 7375 pg/mL — AB (ref 0–450)

## 2013-12-11 MED ORDER — ENOXAPARIN SODIUM 40 MG/0.4ML ~~LOC~~ SOLN
40.0000 mg | SUBCUTANEOUS | Status: DC
  Administered 2013-12-11 – 2013-12-14 (×4): 40 mg via SUBCUTANEOUS
  Filled 2013-12-11 (×4): qty 0.4

## 2013-12-11 MED ORDER — FUROSEMIDE 10 MG/ML IJ SOLN
40.0000 mg | Freq: Once | INTRAMUSCULAR | Status: AC
  Administered 2013-12-11: 40 mg via INTRAVENOUS
  Filled 2013-12-11: qty 4

## 2013-12-11 NOTE — Progress Notes (Signed)
Echo Lab  2D Echocardiogram completed.  [place], [place] 12/11/2013 2:09 PM

## 2013-12-11 NOTE — Progress Notes (Addendum)
[facility] PROGRESS NOTE  VINICIO LYNK JXB:[id number] DOB: [birth year] DOA: 12/10/2013 PCP: Maggie Font, MD  Assessment/Plan: Active Problems:   DM   HYPERLIPIDEMIA   Essential hypertension, benign   CORONARY ATHEROSCLEROSIS, NATIVE VESSEL   Ischemic cardiomyopathy   COPD with emphysema   Chronic systolic heart failure   Chronic respiratory failure with hypoxia   CAP (community acquired pneumonia)     Acute on chronic Hypoxemic respiratory failure  History of COPD, systolic heart failure  I feel this is a combination of both COPD exacerbation secondary to pneumonia as well as acute on chronic systolic heart failure.  Patient does have a previous right lower lobe scar, and a previous 1.4 cm nodule or scar in the anterior right upper lobe, previous PET scans have not revealed any increase activity in the right upper lobe  CT scan repeated on 4/13 showed resolution of the right upper lobe nodule, and centrilobular emphysema  Oxygen Dependent at home  Last 2-D echo 05/12/12 >> EF 55%, mod LA dilation, mild/mod RA dilation, PAS 62 mmHg , repeat 2-D echo pending Sleep study 10/19/12 >> AHI 0.2, used 3 liters oxygen  Patien to continue in step down , wean of oxygen today  CT scan does not show any evidence of acute bony embolism, show centrilobular emphysema Empiric antibiotics, IV steroids, blood culture x2, d-dimer  Nebs, Spiriva  Will transfer to telemetry once the patient is weaned off Ventimask    Urinary tract infection  Patient on Rocephin  Obtain urine culture    History of Cardiomyopathy Lasix IV today   Coronary artery disease-cardiac enzymes negative, continue aspirin, continue Coreg   Type 2 diabetes-sliding scale insulin, continue glipizide, hold metformin, hemoglobin A1c pending  Essential hypertension-continue Coreg   Hyperlipidemia-continue Zocor       Code Status: full Family Communication: family updated about patient's clinical  progress Disposition Plan:  As above    Brief narrative: 79 year old male with a history of diabetes, coronary artery disease, status post PCI and CABG, atrial fibrillation, COPD, hypertension presented to the ER with shortness of breath and dysuria. Shortness of breath started one week ago it is associated with pleuritic chest pain. Patient was found to be percent on room air 88% on 4 L of oxygen. He has had a productive cough with brown sputum and also has experienced some worsening dependent edema in the last one week. He denies any weight gain or weight loss. He denies any subjective fevers. He denies any nausea vomiting or diarrhea. He wears 3 L of oxygen at night.  Was found to have a urinary tract infection, pro BNP 6470, troponin negative  Is currently on 50% FiO2 Ventimask and is being admitted to step down  Chest x-ray shows right upper lobe airspace disease   Consultants:  None  Procedures:  None  Antibiotics:  Rocephin  Azithromycin  HPI/Subjective: Overall feeling better  Objective: Filed Vitals:   12/11/13 0513 12/11/13 0600 12/11/13 0800 12/11/13 0851  BP: 137/72 118/57 142/56   Pulse: 76  77   Temp: 98 F (36.7 C)  96 F (35.6 C)   TempSrc: Axillary  Axillary   Resp: 16  21   Height:      Weight:      SpO2: 94%  87% 90%    Intake/Output Summary (Last 24 hours) at 12/11/13 1023 Last data filed at 12/11/13 0950  Gross per 24 hour  Intake    420 ml  Output    900  ml  Net   -480 ml    Exam:  General: alert & oriented x 3 In NAD  Cardiovascular: RRR, nl S1 s2  Respiratory: Decreased breath sounds at the bases, scattered rhonchi, no crackles  Abdomen: soft +BS NT/ND, no masses palpable  Extremities: No cyanosis and no edema      Data Reviewed: Basic Metabolic Panel:  Recent Labs Lab 12/10/13 1320 12/10/13 1640 12/11/13 0319  NA 135*  --  133*  K 4.1  --  3.8  CL 95*  --  97  CO2 22  --  22  GLUCOSE 301*  --  260*  BUN 13  --  18   CREATININE 1.26 1.15 1.14  CALCIUM 9.2  --  8.4  MG  --  1.5  --     Liver Function Tests:  Recent Labs Lab 12/10/13 1320 12/10/13 1640 12/11/13 0319  AST 23 22 17   ALT 29 25 21   ALKPHOS 87 78 68  BILITOT 2.6* 2.4* 2.3*  PROT 7.8 7.5 6.5  ALBUMIN 3.8 3.5 3.0*   No results found for this basename: LIPASE, AMYLASE,  in the last 168 hours No results found for this basename: AMMONIA,  in the last 168 hours  CBC:  Recent Labs Lab 12/10/13 1320 12/10/13 1640 12/11/13 0319  WBC 9.5 10.3 11.2*  NEUTROABS 7.8*  --   --   HGB 16.5 15.9 14.3  HCT 47.8 46.2 41.9  MCV 82.8 82.5 82.5  PLT 100* 96* 85*    Cardiac Enzymes:  Recent Labs Lab 12/10/13 1320 12/10/13 1640 12/10/13 2335 12/11/13 0319  TROPONINI <0.30 <0.30 <0.30 <0.30   BNP (last 3 results)  Recent Labs  12/10/13 1320 12/11/13 0319  PROBNP 6470.0* 7375.0*     CBG:  Recent Labs Lab 12/10/13 1731 12/10/13 2128 12/11/13 0813  GLUCAP 256* 198* 293*    Recent Results (from the past 240 hour(s))  MRSA PCR SCREENING     Status: None   Collection Time    12/10/13  8:08 PM      Result Value Ref Range Status   MRSA by PCR NEGATIVE  NEGATIVE Final   Comment:            The GeneXpert MRSA Assay (FDA     approved for NASAL specimens     only), is one component of a     comprehensive MRSA colonization     surveillance program. It is not     intended to diagnose MRSA     infection nor to guide or     monitor treatment for     MRSA infections.     Studies: Dg Chest 2 View  12/10/2013   CLINICAL DATA:  Short of breath. Cough. Prior CABG. Hypertension. Diabetes.  EXAM: CHEST  2 VIEW  COMPARISON:  12/24/2010  FINDINGS: Lateral view degraded by patient arm position. Prior median sternotomy. Midline trachea. Mild cardiomegaly with a tortuous thoracic aorta. No pleural effusion or pneumothorax. subtle right upper lobe airspace disease is suspected laterally. Most apparent on the frontal radiograph.   IMPRESSION: Suspicion of right upper lobe airspace disease/pneumonia. This is subtle and warrants followup.  Cardiomegaly without congestive failure.   Electronically Signed   By: Abigail Miyamoto M.D.   On: 12/10/2013 14:09   Ct Angio Chest Pe W/cm &/or Wo Cm  12/10/2013   ADDENDUM REPORT: 12/10/2013 20:14  ADDENDUM: Mild enlargement of the main pulmonary artery, which can be seen in the setting of pulmonary  arterial hypertension.   Electronically Signed   By: Logan Bores   On: 12/10/2013 20:14   12/10/2013   CLINICAL DATA:  Shortness of breath and decreased oxygen saturation. Rule out malignancy or pulmonary embolism.  EXAM: CT ANGIOGRAPHY CHEST WITH CONTRAST  TECHNIQUE: Multidetector CT imaging of the chest was performed using the standard protocol during bolus administration of intravenous contrast. Multiplanar CT image reconstructions and MIPs were obtained to evaluate the vascular anatomy.  CONTRAST:  59mL OMNIPAQUE IOHEXOL 350 MG/ML SOLN  COMPARISON:  Chest radiographs earlier today.  Chest CT 09/16/2011.  FINDINGS: The main pulmonary artery is mildly enlarged, measuring 3.5 cm, similar to the prior CT. No filling defects suggestive of pulmonary emboli are identified. Extensive atherosclerotic calcification is noted involving the thoracic aorta and coronary arteries. Heart is mildly enlarged. Sequelae of prior CABG are identified.  No enlarged axillary, mediastinal, or hilar lymph nodes are identified. No pleural or pericardial effusion is present. Evaluation of the lung parenchyma is mildly limited by respiratory motion artifact. There is centrilobular emphysema and biapical scarring. There is mild dependent subsegmental atelectasis involving the right greater than left lower lobes and posterior right upper lobe. No confluent opacity suggestive of pneumonia is identified. Mild right lower lobe scarring is again seen. Airways are patent.  The visualized portion of the upper abdomen is unremarkable. 2.5 cm  lipoma is again seen in the right subscapularis muscle. Mild-to-moderate thoracic spondylosis is noted.  Review of the MIP images confirms the above findings.  IMPRESSION: 1. No evidence of acute pulmonary emboli. 2. Centrilobular emphysema.  No mass.  Electronically Signed: By: Logan Bores On: 12/10/2013 18:47    Scheduled Meds: . aspirin EC  81 mg Oral Daily  . azithromycin  500 mg Intravenous Q24H  . carvedilol  12.5 mg Oral BID WC  . cefTRIAXone (ROCEPHIN)  IV  1 g Intravenous Q24H  . enoxaparin (LOVENOX) injection  40 mg Subcutaneous Q24H  . glipiZIDE  10 mg Oral BID AC  . insulin aspart  0-15 Units Subcutaneous TID WC  . levalbuterol  0.63 mg Nebulization Q6H  . methylPREDNISolone (SOLU-MEDROL) injection  60 mg Intravenous Q12H  . simvastatin  20 mg Oral QHS  . sodium chloride  3 mL Intravenous Q12H  . tiotropium  18 mcg Inhalation Daily   Continuous Infusions:   Active Problems:   DM   HYPERLIPIDEMIA   Essential hypertension, benign   CORONARY ATHEROSCLEROSIS, NATIVE VESSEL   Ischemic cardiomyopathy   COPD with emphysema   Chronic systolic heart failure   Chronic respiratory failure with hypoxia   CAP (community acquired pneumonia)    Time spent: 79 minutes   [place] [facility] Pager [phone]. If 7PM-7AM, please contact night-coverage at [email or web address], password [facility] 12/11/2013, 10:23 AM  LOS: 1 day

## 2013-12-11 NOTE — Progress Notes (Signed)
12/11/2013 patient was on a venti mask, satting 89-92%. He was placed on [place] 5L satting 88-91, while on [place] saturation decrease 88-89. Md was notified at 1755 per MD place on [place] at 6 Liters and keep sat 90-92. [facility] RN.

## 2013-12-11 NOTE — Progress Notes (Signed)
UR Completed.  [name] [phone] 12/11/2013  

## 2013-12-12 LAB — CBC
HCT: 44.1 % (ref 39.0–52.0)
Hemoglobin: 15 g/dL (ref 13.0–17.0)
MCH: 28.5 pg (ref 26.0–34.0)
MCHC: 34 g/dL (ref 30.0–36.0)
MCV: 83.7 fL (ref 78.0–100.0)
PLATELETS: 85 10*3/uL — AB (ref 150–400)
RBC: 5.27 MIL/uL (ref 4.22–5.81)
RDW: 16.8 % — ABNORMAL HIGH (ref 11.5–15.5)
WBC: 11.6 10*3/uL — ABNORMAL HIGH (ref 4.0–10.5)

## 2013-12-12 LAB — GLUCOSE, CAPILLARY
GLUCOSE-CAPILLARY: 304 mg/dL — AB (ref 70–99)
GLUCOSE-CAPILLARY: 343 mg/dL — AB (ref 70–99)
Glucose-Capillary: 335 mg/dL — ABNORMAL HIGH (ref 70–99)
Glucose-Capillary: 369 mg/dL — ABNORMAL HIGH (ref 70–99)
Glucose-Capillary: 406 mg/dL — ABNORMAL HIGH (ref 70–99)

## 2013-12-12 LAB — COMPREHENSIVE METABOLIC PANEL
ALT: 20 U/L (ref 0–53)
ANION GAP: 13 (ref 5–15)
AST: 22 U/L (ref 0–37)
Albumin: 2.8 g/dL — ABNORMAL LOW (ref 3.5–5.2)
Alkaline Phosphatase: 106 U/L (ref 39–117)
BILIRUBIN TOTAL: 1.2 mg/dL (ref 0.3–1.2)
BUN: 32 mg/dL — AB (ref 6–23)
CHLORIDE: 100 meq/L (ref 96–112)
CO2: 22 meq/L (ref 19–32)
CREATININE: 1.07 mg/dL (ref 0.50–1.35)
Calcium: 8.4 mg/dL (ref 8.4–10.5)
GFR calc Af Amer: 75 mL/min — ABNORMAL LOW (ref >90)
GFR, EST NON AFRICAN AMERICAN: 65 mL/min — AB (ref >90)
GLUCOSE: 275 mg/dL — AB (ref 70–99)
Potassium: 4.4 mEq/L (ref 3.7–5.3)
Sodium: 135 mEq/L — ABNORMAL LOW (ref 137–147)
Total Protein: 6.5 g/dL (ref 6.0–8.3)

## 2013-12-12 MED ORDER — LEVALBUTEROL HCL 0.63 MG/3ML IN NEBU
0.6300 mg | INHALATION_SOLUTION | Freq: Two times a day (BID) | RESPIRATORY_TRACT | Status: DC
  Administered 2013-12-13 – 2013-12-14 (×2): 0.63 mg via RESPIRATORY_TRACT
  Filled 2013-12-12 (×5): qty 3

## 2013-12-12 MED ORDER — RANITIDINE HCL 150 MG/10ML PO SYRP: 150.0000 mg | ORAL_SOLUTION | Freq: Two times a day (BID) | ORAL | Status: DC

## 2013-12-12 MED ORDER — FUROSEMIDE 10 MG/ML IJ SOLN
40.0000 mg | Freq: Two times a day (BID) | INTRAMUSCULAR | Status: DC
  Administered 2013-12-12 – 2013-12-13 (×3): 40 mg via INTRAVENOUS
  Filled 2013-12-12 (×5): qty 4

## 2013-12-12 MED ORDER — FAMOTIDINE 20 MG PO TABS
20.0000 mg | ORAL_TABLET | Freq: Two times a day (BID) | ORAL | Status: DC
  Administered 2013-12-12 – 2013-12-14 (×4): 20 mg via ORAL
  Filled 2013-12-12 (×6): qty 1

## 2013-12-12 MED ORDER — INSULIN GLARGINE 100 UNIT/ML ~~LOC~~ SOLN
25.0000 [IU] | SUBCUTANEOUS | Status: DC
  Administered 2013-12-12 – 2013-12-13 (×2): 25 [IU] via SUBCUTANEOUS
  Filled 2013-12-12 (×4): qty 0.25

## 2013-12-12 NOTE — Progress Notes (Signed)
Inpatient Diabetes Program Recommendations  AACE/ADA: New Consensus Statement on Inpatient Glycemic Control (2013)  Target Ranges:  Prepandial:   less than 140 mg/dL      Peak postprandial:   less than 180 mg/dL (1-2 hours)      Critically ill patients:  140 - 180 mg/dL   Inpatient Diabetes Program Recommendations Insulin - Basal: While on solumedrol, please consider adding low dose basal lantus/levemir at 10-15 units daily or HS  Thank you, Rosita Kea, RN, CNS, Diabetes Coordinator [phone])

## 2013-12-12 NOTE — Progress Notes (Signed)
Nutrition Brief Note  Patient identified on the Malnutrition Screening Tool (MST) Report for recent weight lost without trying and eating poorly because of a decreased appetite.  Per wt readings, pt has had a 5% weight loss since March 2015; not significant for time frame.  Wt Readings from Last 15 Encounters:  12/12/13 185 lb 3 oz (84 kg)  06/09/13 195 lb 9.6 oz (88.724 kg)  05/10/13 190 lb (86.183 kg)  03/14/13 192 lb (87.091 kg)  10/18/12 191 lb (86.637 kg)  05/10/12 201 lb (91.173 kg)  04/20/12 196 lb 3.2 oz (88.996 kg)  09/22/11 195 lb 12.8 oz (88.814 kg)  07/21/11 183 lb (83.008 kg)  04/29/11 197 lb (89.359 kg)  04/24/11 199 lb 3.2 oz (90.357 kg)  03/24/11 196 lb (88.905 kg)  03/18/11 193 lb (87.544 kg)  03/13/11 195 lb (88.451 kg)  03/10/11 198 lb (89.812 kg)    Body mass index is 25.84 kg/(m^2). Patient meets criteria for Overweight based on current BMI.   Current diet order is Heart Healthy, patient is consuming approximately 100% of meals at this time. Labs and medications reviewed.   No nutrition interventions warranted at this time. If nutrition issues arise, please consult RD.   Arthur Holms, RD, LDN Pager #: [phone] After-Hours Pager #: [phone]

## 2013-12-12 NOTE — Progress Notes (Signed)
Paged pharmacy in reference to decrease novolog from ss order and givel Lantus 25 units, per pharmacy doctor needs to put in this order.

## 2013-12-12 NOTE — Progress Notes (Signed)
Per v/o Dr. Allyson Sabal only giving 10 units novolog ss decreased from 15 and adding 25 units Lantus.

## 2013-12-12 NOTE — Progress Notes (Signed)
Pt transferred to room 6E27 from [facility] via wheelchair.  Pt alert and oriented.  VSS.  Denies pain at this time.  Pleasant. Family at bedside. O2 sat 92% on 5 lpm via [place].

## 2013-12-12 NOTE — Care Management Note (Signed)
    Page 1 of 1   12/12/2013     12:17:14 PM CARE MANAGEMENT NOTE 12/12/2013  Patient:  SEVERINO, PAOLO   Account Number:  [id number]  Date Initiated:  12/12/2013  Documentation initiated by:  Jeanmarie Mccowen  Subjective/Objective Assessment:   dx PNA; lives with spouse    PCP  [place]  CM consult      Status of service:  Completed, signed off   Per UR Regulation:  Reviewed for med. necessity/level of care/duration of stay  Comments:  12/12/13 1213 [place] Received referral to arrange appointments with pt's PCP and cardiologist.  Provided pt with telephone number for Dr Berdine Addison and Dr Rozann Lesches.  Per pt, his son will call and arrange appointments tomorrow a.m. when physician offices are open.

## 2013-12-12 NOTE — Progress Notes (Signed)
Paged Dr. Allyson Sabal, pt co heartburn and states he takes Zantac daily at home.  Nothing ordered at this time for heartburn.

## 2013-12-12 NOTE — Progress Notes (Addendum)
[facility] PROGRESS NOTE  SIDI DZIKOWSKI ZHY:[id number] DOB: [birth year] DOA: 12/10/2013 PCP: Maggie Font, MD  Assessment/Plan: Active Problems:   DM   HYPERLIPIDEMIA   Essential hypertension, benign   CORONARY ATHEROSCLEROSIS, NATIVE VESSEL   Ischemic cardiomyopathy   COPD with emphysema   Chronic systolic heart failure   Chronic respiratory failure with hypoxia   CAP (community acquired pneumonia)   Acute on chronic Hypoxemic respiratory failure  Secondary to COPD exacerbation and combined acute on chronic systolic and diastolic heart failure Patient does have a previous right lower lobe scar, and a previous 1.4 cm nodule or scar in the anterior right upper lobe, previous PET scans have not revealed any increase activity in the right upper lobe  CT scan repeated on 4/13 showed resolution of the right upper lobe nodule, and centrilobular emphysema  Oxygen Dependent at home 3-4 L Last 2-D echo 05/12/12 >> EF 55%, mod LA dilation, mild/mod RA dilation, PAS 62 mmHg , repeat 2-D echo shows mild decrease in ejection fraction and combined systolic and diastolic heart failure Sleep study 10/19/12 >> AHI 0.2, used 3 liters oxygen  Wean oxygen levels down to baseline Transfer to telemetry CT scan does not show any evidence of acute bony embolism, show centrilobular emphysema  Continue Empiric antibiotics, continue IV steroids, blood culture x2, d-dimer  Nebs, Spiriva  Transfer to telemetry today He will need outpatient cardiology appointment, his cardiologist is in [place] The patient will set up this appointment before leaving the hospital   Urinary tract infection  Patient on Rocephin  Obtain urine culture   History of Cardiomyopathy  Lasix IV today , monitor renal function  Coronary artery disease-cardiac enzymes negative, continue aspirin, continue Coreg   Type 2 diabetes-sliding scale insulin, continue glipizide, hold metformin, hemoglobin A1c 8.3,added lantus due to  uncontrolled CBG   Essential hypertension-continue Coreg  Hyperlipidemia-continue Zocor    Code Status: full  Family Communication: family updated about patients clinical progress  Disposition Plan: Transfer to telemetry, anticipate discharge tomorrow   Brief narrative:  79 year old male with a history of diabetes, coronary artery disease, status post PCI and CABG, atrial fibrillation, COPD, hypertension presented to the ER with shortness of breath and dysuria. Shortness of breath started one week ago it is associated with pleuritic chest pain. Patient was found to be percent on room air 88% on 4 L of oxygen. He has had a productive cough with brown sputum and also has experienced some worsening dependent edema in the last one week. He denies any weight gain or weight loss. He denies any subjective fevers. He denies any nausea vomiting or diarrhea. He wears 3 L of oxygen at night.  Was found to have a urinary tract infection, pro BNP 6470, troponin negative  Is currently on 50% FiO2 Ventimask and is being admitted to step down  Chest x-ray shows right upper lobe airspace disease  Consultants:  None Procedures:  None Antibiotics:  Rocephin  Azithromycin HPI/Subjective:  Overall feeling better  Objective: Filed Vitals:   12/12/13 0400 12/12/13 0600 12/12/13 0753 12/12/13 0948  BP: 125/62 115/54 134/67 136/68  Pulse:   66 72  Temp:   97.6 F (36.4 C)   TempSrc:   Oral   Resp:   13   Height:      Weight:      SpO2:   90% 94%    Intake/Output Summary (Last 24 hours) at 12/12/13 1022 Last data filed at 12/12/13 0902  Gross per 24 hour  Intake    410 ml  Output   2175 ml  Net  -1765 ml    Exam:  General: alert & oriented x 3 In NAD  Cardiovascular: RRR, nl S1 s2  Respiratory: Decreased breath sounds at the bases, scattered rhonchi, no crackles  Abdomen: soft +BS NT/ND, no masses palpable  Extremities: No cyanosis and no edema      Data Reviewed: Basic Metabolic  Panel:  Recent Labs Lab 12/10/13 1320 12/10/13 1640 12/11/13 0319 12/12/13 0245  NA 135*  --  133* 135*  K 4.1  --  3.8 4.4  CL 95*  --  97 100  CO2 22  --  22 22  GLUCOSE 301*  --  260* 275*  BUN 13  --  18 32*  CREATININE 1.26 1.15 1.14 1.07  CALCIUM 9.2  --  8.4 8.4  MG  --  1.5  --   --     Liver Function Tests:  Recent Labs Lab 12/10/13 1320 12/10/13 1640 12/11/13 0319 12/12/13 0245  AST 23 22 17 22   ALT 29 25 21 20   ALKPHOS 87 78 68 106  BILITOT 2.6* 2.4* 2.3* 1.2  PROT 7.8 7.5 6.5 6.5  ALBUMIN 3.8 3.5 3.0* 2.8*   No results found for this basename: LIPASE, AMYLASE,  in the last 168 hours No results found for this basename: AMMONIA,  in the last 168 hours  CBC:  Recent Labs Lab 12/10/13 1320 12/10/13 1640 12/11/13 0319 12/12/13 0245  WBC 9.5 10.3 11.2* 11.6*  NEUTROABS 7.8*  --   --   --   HGB 16.5 15.9 14.3 15.0  HCT 47.8 46.2 41.9 44.1  MCV 82.8 82.5 82.5 83.7  PLT 100* 96* 85* 85*    Cardiac Enzymes:  Recent Labs Lab 12/10/13 1320 12/10/13 1640 12/10/13 2335 12/11/13 0319  TROPONINI <0.30 <0.30 <0.30 <0.30   BNP (last 3 results)  Recent Labs  12/10/13 1320 12/11/13 0319  PROBNP 6470.0* 7375.0*     CBG:  Recent Labs Lab 12/11/13 0813 12/11/13 1115 12/11/13 1622 12/11/13 2330 12/12/13 0752  GLUCAP 293* 350* 394* 335* 304*    Recent Results (from the past 240 hour(s))  MRSA PCR SCREENING     Status: None   Collection Time    12/10/13  8:08 PM      Result Value Ref Range Status   MRSA by PCR NEGATIVE  NEGATIVE Final   Comment:            The GeneXpert MRSA Assay (FDA     approved for NASAL specimens     only), is one component of a     comprehensive MRSA colonization     surveillance program. It is not     intended to diagnose MRSA     infection nor to guide or     monitor treatment for     MRSA infections.     Studies: Dg Chest 2 View  12/10/2013   CLINICAL DATA:  Short of breath. Cough. Prior CABG.  Hypertension. Diabetes.  EXAM: CHEST  2 VIEW  COMPARISON:  12/24/2010  FINDINGS: Lateral view degraded by patient arm position. Prior median sternotomy. Midline trachea. Mild cardiomegaly with a tortuous thoracic aorta. No pleural effusion or pneumothorax. subtle right upper lobe airspace disease is suspected laterally. Most apparent on the frontal radiograph.  IMPRESSION: Suspicion of right upper lobe airspace disease/pneumonia. This is subtle and warrants followup.  Cardiomegaly without congestive failure.   Electronically Signed  By: Abigail Miyamoto M.D.   On: 12/10/2013 14:09   Ct Angio Chest Pe W/cm &/or Wo Cm  12/10/2013   ADDENDUM REPORT: 12/10/2013 20:14  ADDENDUM: Mild enlargement of the main pulmonary artery, which can be seen in the setting of pulmonary arterial hypertension.   Electronically Signed   By: Logan Bores   On: 12/10/2013 20:14   12/10/2013   CLINICAL DATA:  Shortness of breath and decreased oxygen saturation. Rule out malignancy or pulmonary embolism.  EXAM: CT ANGIOGRAPHY CHEST WITH CONTRAST  TECHNIQUE: Multidetector CT imaging of the chest was performed using the standard protocol during bolus administration of intravenous contrast. Multiplanar CT image reconstructions and MIPs were obtained to evaluate the vascular anatomy.  CONTRAST:  30mL OMNIPAQUE IOHEXOL 350 MG/ML SOLN  COMPARISON:  Chest radiographs earlier today.  Chest CT 09/16/2011.  FINDINGS: The main pulmonary artery is mildly enlarged, measuring 3.5 cm, similar to the prior CT. No filling defects suggestive of pulmonary emboli are identified. Extensive atherosclerotic calcification is noted involving the thoracic aorta and coronary arteries. Heart is mildly enlarged. Sequelae of prior CABG are identified.  No enlarged axillary, mediastinal, or hilar lymph nodes are identified. No pleural or pericardial effusion is present. Evaluation of the lung parenchyma is mildly limited by respiratory motion artifact. There is centrilobular  emphysema and biapical scarring. There is mild dependent subsegmental atelectasis involving the right greater than left lower lobes and posterior right upper lobe. No confluent opacity suggestive of pneumonia is identified. Mild right lower lobe scarring is again seen. Airways are patent.  The visualized portion of the upper abdomen is unremarkable. 2.5 cm lipoma is again seen in the right subscapularis muscle. Mild-to-moderate thoracic spondylosis is noted.  Review of the MIP images confirms the above findings.  IMPRESSION: 1. No evidence of acute pulmonary emboli. 2. Centrilobular emphysema.  No mass.  Electronically Signed: By: Logan Bores On: 12/10/2013 18:47    Scheduled Meds: . aspirin EC  81 mg Oral Daily  . azithromycin  500 mg Intravenous Q24H  . carvedilol  12.5 mg Oral BID WC  . cefTRIAXone (ROCEPHIN)  IV  1 g Intravenous Q24H  . enoxaparin (LOVENOX) injection  40 mg Subcutaneous Q24H  . furosemide  40 mg Intravenous BID  . glipiZIDE  10 mg Oral BID AC  . insulin aspart  0-15 Units Subcutaneous TID WC  . levalbuterol  0.63 mg Nebulization Q6H  . methylPREDNISolone (SOLU-MEDROL) injection  60 mg Intravenous Q12H  . simvastatin  20 mg Oral QHS  . sodium chloride  3 mL Intravenous Q12H  . tiotropium  18 mcg Inhalation Daily   Continuous Infusions:   Active Problems:   DM   HYPERLIPIDEMIA   Essential hypertension, benign   CORONARY ATHEROSCLEROSIS, NATIVE VESSEL   Ischemic cardiomyopathy   COPD with emphysema   Chronic systolic heart failure   Chronic respiratory failure with hypoxia   CAP (community acquired pneumonia)    Time spent: 60 minutes   [place] [facility] Pager [phone]. If 7PM-7AM, please contact night-coverage at [email or web address], password [facility] 12/12/2013, 10:22 AM  LOS: 2 days

## 2013-12-12 NOTE — Progress Notes (Signed)
Paged Dr. Allyson Sabal CBG [phone], pt gets 15 units @ 400, any additional coverage.

## 2013-12-13 LAB — CBC
HEMATOCRIT: 44.9 % (ref 39.0–52.0)
Hemoglobin: 15.3 g/dL (ref 13.0–17.0)
MCH: 28.8 pg (ref 26.0–34.0)
MCHC: 34.1 g/dL (ref 30.0–36.0)
MCV: 84.4 fL (ref 78.0–100.0)
PLATELETS: 102 10*3/uL — AB (ref 150–400)
RBC: 5.32 MIL/uL (ref 4.22–5.81)
RDW: 16.7 % — AB (ref 11.5–15.5)
WBC: 11.5 10*3/uL — AB (ref 4.0–10.5)

## 2013-12-13 LAB — COMPREHENSIVE METABOLIC PANEL
ALBUMIN: 2.7 g/dL — AB (ref 3.5–5.2)
ALT: 34 U/L (ref 0–53)
ANION GAP: 12 (ref 5–15)
AST: 31 U/L (ref 0–37)
Alkaline Phosphatase: 88 U/L (ref 39–117)
BILIRUBIN TOTAL: 0.9 mg/dL (ref 0.3–1.2)
BUN: 36 mg/dL — AB (ref 6–23)
CHLORIDE: 99 meq/L (ref 96–112)
CO2: 25 mEq/L (ref 19–32)
CREATININE: 1.13 mg/dL (ref 0.50–1.35)
Calcium: 8.1 mg/dL — ABNORMAL LOW (ref 8.4–10.5)
GFR, EST AFRICAN AMERICAN: 70 mL/min — AB (ref >90)
GFR, EST NON AFRICAN AMERICAN: 61 mL/min — AB (ref >90)
Glucose, Bld: 287 mg/dL — ABNORMAL HIGH (ref 70–99)
Potassium: 4.2 mEq/L (ref 3.7–5.3)
Sodium: 136 mEq/L — ABNORMAL LOW (ref 137–147)
Total Protein: 6.5 g/dL (ref 6.0–8.3)

## 2013-12-13 LAB — GLUCOSE, CAPILLARY
GLUCOSE-CAPILLARY: 166 mg/dL — AB (ref 70–99)
GLUCOSE-CAPILLARY: 244 mg/dL — AB (ref 70–99)
GLUCOSE-CAPILLARY: 309 mg/dL — AB (ref 70–99)
Glucose-Capillary: 247 mg/dL — ABNORMAL HIGH (ref 70–99)

## 2013-12-13 MED ORDER — INSULIN ASPART 100 UNIT/ML ~~LOC~~ SOLN
0.0000 [IU] | Freq: Every day | SUBCUTANEOUS | Status: DC
  Administered 2013-12-13 (×3): 5 [IU] via SUBCUTANEOUS
  Administered 2013-12-14: 3 [IU] via SUBCUTANEOUS
  Administered 2013-12-14: 8 [IU] via SUBCUTANEOUS
  Administered 2013-12-14: 3 [IU] via SUBCUTANEOUS

## 2013-12-13 MED ORDER — PREDNISONE 20 MG PO TABS
40.0000 mg | ORAL_TABLET | Freq: Two times a day (BID) | ORAL | Status: DC
  Administered 2013-12-13 – 2013-12-14 (×3): 40 mg via ORAL
  Filled 2013-12-13 (×6): qty 2

## 2013-12-13 MED ORDER — AZITHROMYCIN 500 MG PO TABS
500.0000 mg | ORAL_TABLET | Freq: Every day | ORAL | Status: DC
  Administered 2013-12-13 – 2013-12-14 (×2): 500 mg via ORAL
  Filled 2013-12-13 (×2): qty 1

## 2013-12-13 MED ORDER — FUROSEMIDE 10 MG/ML IJ SOLN
60.0000 mg | Freq: Two times a day (BID) | INTRAMUSCULAR | Status: DC
  Administered 2013-12-13 – 2013-12-14 (×2): 60 mg via INTRAVENOUS
  Filled 2013-12-13 (×3): qty 6

## 2013-12-13 NOTE — Progress Notes (Signed)
Patient was sitting on recliner with oxygen at 4 lpm,oxygen saturation 89%,heart rate of 74/min,ambulated patient toward the hallway,at 4 lpm ,his saturation dropped to 87% immediately,progressing to walk as he tolerated but his oxygen saturation keep on dropping till 83% with heart rate of 100/min and slight coughing.Brought patient back to his room,not in distress.Will monitor.

## 2013-12-13 NOTE — Progress Notes (Signed)
PHARMACIST - PHYSICIAN COMMUNICATION DR:   Allyson Sabal CONCERNING: Antibiotic IV to Oral Route Change Policy  RECOMMENDATION: This patient is receiving Azithromycin by the intravenous route.  Based on criteria approved by the Pharmacy and Therapeutics Committee, the antibiotic(s) is/are being converted to the equivalent oral dose form(s).   DESCRIPTION: These criteria include:  Patient being treated for a respiratory tract infection, urinary tract infection, cellulitis or clostridium difficile associated diarrhea if on metronidazole  The patient is not neutropenic and does not exhibit a GI malabsorption state  The patient is eating (either orally or via tube) and/or has been taking other orally administered medications for a least 24 hours  The patient is improving clinically and has a Tmax < 100.5  If you have questions about this conversion, please contact the Pharmacy Department  []   [phone] )  [facility] [x]   [phone] )  [facility]  []   [phone] )  [facility] []   [phone] )  [place], PharmD, BCPS Clinical Pharmacist Pager: [phone] 12/13/2013 3:53 PM

## 2013-12-13 NOTE — Care Management Note (Signed)
CARE MANAGEMENT NOTE 12/13/2013  Patient:  Jared Santos, Jared Santos   Account Number:  [id number]  Date Initiated:  12/12/2013  Documentation initiated by:  MAYO,HENRIETTA  Subjective/Objective Assessment:   dx PNA; lives with spouse    PCP  Iona Beard     Action/Plan:   12/13/13 Met with pt who states that he has oxygen at home, a walker and cane.   Anticipated DC Date:  12/15/2013   Anticipated DC Plan:  [place]  CM consult      Choice offered to / List presented to:             Status of service:  Completed, signed off Medicare Important Message given?  YES (If response is "NO", the following Medicare IM given date fields will be blank) Date Medicare IM given:  12/13/2013 Medicare IM given by:  Traven Davids Date Additional Medicare IM given:   Additional Medicare IM given by:    Discharge Disposition:    Per UR Regulation:  Reviewed for med. necessity/level of care/duration of stay  If discussed at [place] of Stay Meetings, dates discussed:    Comments:  12/13/2013 IM given and explained to pt.   CRoyal RN MPH, case manager, [phone]  12/12/13 Lawson RN MSN BSN CCM Received referral to arrange appointments with pt's PCP and cardiologist.  Provided pt with telephone number for Dr Berdine Addison and Dr Rozann Lesches.  Per pt, his son will call and arrange appointments tomorrow a.m. when physician offices are open.

## 2013-12-13 NOTE — Progress Notes (Signed)
Inpatient Diabetes Program Recommendations  AACE/ADA: New Consensus Statement on Inpatient Glycemic Control (2013)  Target Ranges:  Prepandial:   less than 140 mg/dL      Peak postprandial:   less than 180 mg/dL (1-2 hours)      Critically ill patients:  140 - 180 mg/dL   Reason for Assessment: Results for DAYVON, DAX (MRN [id number]) as of 12/13/2013 11:10  Ref. Range 12/12/2013 07:52 12/12/2013 11:44 12/12/2013 16:49 12/12/2013 22:04 12/13/2013 08:45  Glucose-Capillary Latest Range: 70-99 mg/dL 304 (H) 343 (H) 406 (H) 369 (H) 309 (H)   Note CBG's increased with steroids.  Lantus 25 units added 12/12/13. Consider increasing Novolog correction to q 4 hours while on steroids.  A1C=8.3% indicating average CBG's of 190 mg/dL.  Will follow.  Thanks, Adah Perl, RN, BC-ADM Inpatient Diabetes Coordinator Pager [phone]

## 2013-12-13 NOTE — Clinical Documentation Improvement (Signed)
Platelets as below this admission.  Please document associated clinical conditions, if any, and cause, if known,  in progress note and discharge summary.  Component      Platelets  Latest Ref Rng      150 - 400 K/uL  12/10/2013     4:40 PM 96 (L)  12/11/2013     3:19 AM 85 (L)  12/12/2013     2:45 AM 85 (L)  12/13/2013     3:17 AM 102 (L)   Thank you, Mateo Flow, RN [phone] Clinical Documentation Specialist

## 2013-12-13 NOTE — Telephone Encounter (Signed)
Medication sent to pharmacy  

## 2013-12-13 NOTE — Progress Notes (Addendum)
[facility] PROGRESS NOTE  PARMVIR BOOMER ASN:[id number] DOB: [birth year] DOA: 12/10/2013 PCP: Maggie Font, MD  Assessment/Plan: Active Problems:   DM   HYPERLIPIDEMIA   Essential hypertension, benign   CORONARY ATHEROSCLEROSIS, NATIVE VESSEL   Ischemic cardiomyopathy   COPD with emphysema   Chronic systolic heart failure   Chronic respiratory failure with hypoxia   CAP (community acquired pneumonia)   Acute on chronic Hypoxemic respiratory failure  Secondary to COPD exacerbation and combined acute on chronic systolic and diastolic heart failure  Patient does have a previous right lower lobe scar, and a previous 1.4 cm nodule or scar in the anterior right upper lobe, previous PET scans have not revealed any increase activity in the right upper lobe  CT scan repeated on 4/13 showed resolution of the right upper lobe nodule, and centrilobular emphysema  Oxygen Dependent at home 3-4 L  Last 2-D echo 05/12/12 >> EF 55%, mod LA dilation, mild/mod RA dilation, PAS 62 mmHg , repeat 2-D echo this admission shows mild decrease in ejection fraction and combined systolic and diastolic heart failure  Sleep study 10/19/12 >> AHI 0.2, uses 4 liters oxygen at bedtime Wean oxygen levels down to baseline  Continue telemetry CT scan does not show any evidence of acute bony embolism, show centrilobular emphysema  Continue Empiric antibiotics, switch IV steroids to by mouth, blood culture x2,  Nebs, Spiriva  He will need outpatient cardiology appointment, his cardiologist is in [place]  The patient will set up this appointment before leaving the hospital  Continue diuresis with IV Lasix, increased to improve oxygenation Oxygenation may improve with continued diuresis, Currently desaturating on 4 L  Urinary tract infection  Patient on Rocephin  Obtain urine culture   History of Cardiomyopathy  Continue IV Lasix, monitor renal function   Coronary artery disease-cardiac enzymes negative,  continue aspirin, continue Coreg   Type 2 diabetes-sliding scale insulin, continue glipizide, hold metformin, hemoglobin A1c 8.3,added lantus due to uncontrolled CBG   Essential hypertension-continue Coreg  Hyperlipidemia-continue Zocor    Code Status: full  Family Communication: family updated about patients clinical progress  Disposition Plan: Probably tomorrow if what the levels are improved  Brief narrative:  79 year old male with a history of diabetes, coronary artery disease, status post PCI and CABG, atrial fibrillation, COPD, hypertension presented to the ER with shortness of breath and dysuria. Shortness of breath started one week ago it is associated with pleuritic chest pain. Patient was found to be percent on room air 88% on 4 L of oxygen. He has had a productive cough with brown sputum and also has experienced some worsening dependent edema in the last one week. He denies any weight gain or weight loss. He denies any subjective fevers. He denies any nausea vomiting or diarrhea. He wears 3 L of oxygen at night.  Was found to have a urinary tract infection, pro BNP 6470, troponin negative  Is currently on 50% FiO2 Ventimask and is being admitted to step down  Chest x-ray shows right upper lobe airspace disease  Consultants:  None Procedures:  None Antibiotics:  Rocephin  Azithromycin HPI/Subjective:  Overall feeling better but still desaturating on baseline oxygen level of 4 L that he uses at home  Objective: Filed Vitals:   12/12/13 2053 12/12/13 2206 12/13/13 0532 12/13/13 1000  BP:  131/66 144/66 128/83  Pulse:  70 68 66  Temp:  98 F (36.7 C) 98.6 F (37 C) 97.5 F (36.4 C)  TempSrc:  Oral Oral  Oral  Resp:  18 18 17   Height:      Weight:  83.6 kg (184 lb 4.9 oz)    SpO2: 92% 93% 92% 93%    Intake/Output Summary (Last 24 hours) at 12/13/13 1304 Last data filed at 12/13/13 1035  Gross per 24 hour  Intake   1350 ml  Output   3120 ml  Net  -1770 ml     Exam:  General: alert & oriented x 3 In NAD  Cardiovascular: RRR, nl S1 s2  Respiratory: Decreased breath sounds at the bases, scattered rhonchi, no crackles  Abdomen: soft +BS NT/ND, no masses palpable  Extremities: No cyanosis and no edema      Data Reviewed: Basic Metabolic Panel:  Recent Labs Lab 12/10/13 1320 12/10/13 1640 12/11/13 0319 12/12/13 0245 12/13/13 0317  NA 135*  --  133* 135* 136*  K 4.1  --  3.8 4.4 4.2  CL 95*  --  97 100 99  CO2 22  --  22 22 25   GLUCOSE 301*  --  260* 275* 287*  BUN 13  --  18 32* 36*  CREATININE 1.26 1.15 1.14 1.07 1.13  CALCIUM 9.2  --  8.4 8.4 8.1*  MG  --  1.5  --   --   --     Liver Function Tests:  Recent Labs Lab 12/10/13 1320 12/10/13 1640 12/11/13 0319 12/12/13 0245 12/13/13 0317  AST 23 22 17 22 31   ALT 29 25 21 20  34  ALKPHOS 87 78 68 106 88  BILITOT 2.6* 2.4* 2.3* 1.2 0.9  PROT 7.8 7.5 6.5 6.5 6.5  ALBUMIN 3.8 3.5 3.0* 2.8* 2.7*   No results found for this basename: LIPASE, AMYLASE,  in the last 168 hours No results found for this basename: AMMONIA,  in the last 168 hours  CBC:  Recent Labs Lab 12/10/13 1320 12/10/13 1640 12/11/13 0319 12/12/13 0245 12/13/13 0317  WBC 9.5 10.3 11.2* 11.6* 11.5*  NEUTROABS 7.8*  --   --   --   --   HGB 16.5 15.9 14.3 15.0 15.3  HCT 47.8 46.2 41.9 44.1 44.9  MCV 82.8 82.5 82.5 83.7 84.4  PLT 100* 96* 85* 85* 102*    Cardiac Enzymes:  Recent Labs Lab 12/10/13 1320 12/10/13 1640 12/10/13 2335 12/11/13 0319  TROPONINI <0.30 <0.30 <0.30 <0.30   BNP (last 3 results)  Recent Labs  12/10/13 1320 12/11/13 0319  PROBNP 6470.0* 7375.0*     CBG:  Recent Labs Lab 12/12/13 1144 12/12/13 1649 12/12/13 2204 12/13/13 0845 12/13/13 1138  GLUCAP 343* 406* 369* 309* 247*    Recent Results (from the past 240 hour(s))  MRSA PCR SCREENING     Status: None   Collection Time    12/10/13  8:08 PM      Result Value Ref Range Status   MRSA by PCR  NEGATIVE  NEGATIVE Final   Comment:            The GeneXpert MRSA Assay (FDA     approved for NASAL specimens     only), is one component of a     comprehensive MRSA colonization     surveillance program. It is not     intended to diagnose MRSA     infection nor to guide or     monitor treatment for     MRSA infections.     Studies: Dg Chest 2 View  12/10/2013   CLINICAL DATA:  Short  of breath. Cough. Prior CABG. Hypertension. Diabetes.  EXAM: CHEST  2 VIEW  COMPARISON:  12/24/2010  FINDINGS: Lateral view degraded by patient arm position. Prior median sternotomy. Midline trachea. Mild cardiomegaly with a tortuous thoracic aorta. No pleural effusion or pneumothorax. subtle right upper lobe airspace disease is suspected laterally. Most apparent on the frontal radiograph.  IMPRESSION: Suspicion of right upper lobe airspace disease/pneumonia. This is subtle and warrants followup.  Cardiomegaly without congestive failure.   Electronically Signed   By: Abigail Miyamoto M.D.   On: 12/10/2013 14:09   Ct Angio Chest Pe W/cm &/or Wo Cm  12/10/2013   ADDENDUM REPORT: 12/10/2013 20:14  ADDENDUM: Mild enlargement of the main pulmonary artery, which can be seen in the setting of pulmonary arterial hypertension.   Electronically Signed   By: Logan Bores   On: 12/10/2013 20:14   12/10/2013   CLINICAL DATA:  Shortness of breath and decreased oxygen saturation. Rule out malignancy or pulmonary embolism.  EXAM: CT ANGIOGRAPHY CHEST WITH CONTRAST  TECHNIQUE: Multidetector CT imaging of the chest was performed using the standard protocol during bolus administration of intravenous contrast. Multiplanar CT image reconstructions and MIPs were obtained to evaluate the vascular anatomy.  CONTRAST:  19mL OMNIPAQUE IOHEXOL 350 MG/ML SOLN  COMPARISON:  Chest radiographs earlier today.  Chest CT 09/16/2011.  FINDINGS: The main pulmonary artery is mildly enlarged, measuring 3.5 cm, similar to the prior CT. No filling defects  suggestive of pulmonary emboli are identified. Extensive atherosclerotic calcification is noted involving the thoracic aorta and coronary arteries. Heart is mildly enlarged. Sequelae of prior CABG are identified.  No enlarged axillary, mediastinal, or hilar lymph nodes are identified. No pleural or pericardial effusion is present. Evaluation of the lung parenchyma is mildly limited by respiratory motion artifact. There is centrilobular emphysema and biapical scarring. There is mild dependent subsegmental atelectasis involving the right greater than left lower lobes and posterior right upper lobe. No confluent opacity suggestive of pneumonia is identified. Mild right lower lobe scarring is again seen. Airways are patent.  The visualized portion of the upper abdomen is unremarkable. 2.5 cm lipoma is again seen in the right subscapularis muscle. Mild-to-moderate thoracic spondylosis is noted.  Review of the MIP images confirms the above findings.  IMPRESSION: 1. No evidence of acute pulmonary emboli. 2. Centrilobular emphysema.  No mass.  Electronically Signed: By: Logan Bores On: 12/10/2013 18:47    Scheduled Meds: . aspirin EC  81 mg Oral Daily  . azithromycin  500 mg Intravenous Q24H  . carvedilol  12.5 mg Oral BID WC  . cefTRIAXone (ROCEPHIN)  IV  1 g Intravenous Q24H  . enoxaparin (LOVENOX) injection  40 mg Subcutaneous Q24H  . famotidine  20 mg Oral BID  . furosemide  40 mg Intravenous BID  . glipiZIDE  10 mg Oral BID AC  . insulin aspart  0-15 Units Subcutaneous TID WC  . insulin glargine  25 Units Subcutaneous Q24H  . levalbuterol  0.63 mg Nebulization BID  . methylPREDNISolone (SOLU-MEDROL) injection  60 mg Intravenous Q12H  . simvastatin  20 mg Oral QHS  . sodium chloride  3 mL Intravenous Q12H  . tiotropium  18 mcg Inhalation Daily   Continuous Infusions:   Active Problems:   DM   HYPERLIPIDEMIA   Essential hypertension, benign   CORONARY ATHEROSCLEROSIS, NATIVE VESSEL   Ischemic  cardiomyopathy   COPD with emphysema   Chronic systolic heart failure   Chronic respiratory failure with hypoxia  CAP (community acquired pneumonia)    Time spent: 31 minutes   [facility] Pager [phone]. If 7PM-7AM, please contact night-coverage at [email or web address], password [facility] 12/13/2013, 1:04 PM  LOS: 3 days

## 2013-12-14 LAB — COMPREHENSIVE METABOLIC PANEL
ALT: 44 U/L (ref 0–53)
AST: 37 U/L (ref 0–37)
Albumin: 2.6 g/dL — ABNORMAL LOW (ref 3.5–5.2)
Alkaline Phosphatase: 87 U/L (ref 39–117)
Anion gap: 14 (ref 5–15)
BUN: 40 mg/dL — ABNORMAL HIGH (ref 6–23)
CALCIUM: 7.9 mg/dL — AB (ref 8.4–10.5)
CO2: 21 mEq/L (ref 19–32)
CREATININE: 1.14 mg/dL (ref 0.50–1.35)
Chloride: 100 mEq/L (ref 96–112)
GFR calc Af Amer: 70 mL/min — ABNORMAL LOW (ref >90)
GFR calc non Af Amer: 60 mL/min — ABNORMAL LOW (ref >90)
Glucose, Bld: 225 mg/dL — ABNORMAL HIGH (ref 70–99)
Potassium: 4.4 mEq/L (ref 3.7–5.3)
Sodium: 135 mEq/L — ABNORMAL LOW (ref 137–147)
Total Bilirubin: 0.8 mg/dL (ref 0.3–1.2)
Total Protein: 6.3 g/dL (ref 6.0–8.3)

## 2013-12-14 LAB — GLUCOSE, CAPILLARY
GLUCOSE-CAPILLARY: 212 mg/dL — AB (ref 70–99)
Glucose-Capillary: 274 mg/dL — ABNORMAL HIGH (ref 70–99)
Glucose-Capillary: 176 mg/dL — ABNORMAL HIGH (ref 70–99)
Glucose-Capillary: 179 mg/dL — ABNORMAL HIGH (ref 70–99)

## 2013-12-14 MED ORDER — PREDNISONE 5 MG PO TABS: ORAL_TABLET | ORAL | Status: DC

## 2013-12-14 MED ORDER — LEVOFLOXACIN 500 MG PO TABS: 500.0000 mg | ORAL_TABLET | Freq: Every day | ORAL | Status: DC

## 2013-12-14 MED ORDER — FUROSEMIDE 40 MG PO TABS: 40.0000 mg | ORAL_TABLET | Freq: Two times a day (BID) | ORAL | Status: DC

## 2013-12-14 MED ORDER — POTASSIUM CHLORIDE ER 10 MEQ PO TBCR: 10.0000 meq | EXTENDED_RELEASE_TABLET | Freq: Every day | ORAL | Status: AC

## 2013-12-14 MED ORDER — LISINOPRIL 2.5 MG PO TABS: 2.5000 mg | ORAL_TABLET | Freq: Every day | ORAL | Status: AC

## 2013-12-14 NOTE — Progress Notes (Signed)
Sitting at the bedside,patient 's 5 lpm oxygen saturation was 98%,started ambulating on the entire hallway and he was able to maintained an oxygen saturation range of 94%-96% at 5 lpm.Not in distress.No complained.

## 2013-12-14 NOTE — Progress Notes (Signed)
Patient baseine oxygen saturation at 5 lpm while sitting at the bedside was 98%.Ambulated on the hallway and he was able to maintained 94%-96% oxygen saturation at 5 lpm all throughout his ambulation on the entire hallway.No distress noted.No complained.

## 2013-12-14 NOTE — Discharge Instructions (Signed)
Follow with Primary MD Maggie Font, MD in 7 days   Get CBC, CMP, 2 view Chest X ray checked  by Primary MD next visit.    Activity: As tolerated with Full fall precautions use walker/cane & assistance as needed   Disposition Home    Diet: Heart Healthy Low carb  Accuchecks 4 times/day, Once in AM empty stomach and then before each meal. Log in all results and show them to your Prim.MD in 3 days. If any glucose reading is under 80 or above 300 call your Prim MD immidiately. Follow Low glucose instructions for glucose under 80 as instructed.   For Heart failure patients - Check your Weight same time everyday, if you gain over 2 pounds, or you develop in leg swelling, experience more shortness of breath or chest pain, call your Primary MD immediately. Follow Cardiac Low Salt Diet and 1.8 lit/day fluid restriction.   On your next visit with her primary care physician please Get Medicines reviewed and adjusted.  Please request your Prim.MD to go over all Hospital Tests and Procedure/Radiological results at the follow up, please get all Hospital records sent to your Prim MD by signing hospital release before you go home.   If you experience worsening of your admission symptoms, develop shortness of breath, life threatening emergency, suicidal or homicidal thoughts you must seek medical attention immediately by calling 911 or calling your MD immediately  if symptoms less severe.  You Must read complete instructions/literature along with all the possible adverse reactions/side effects for all the Medicines you take and that have been prescribed to you. Take any new Medicines after you have completely understood and accpet all the possible adverse reactions/side effects.   Do not drive, operating heavy machinery, perform activities at heights, swimming or participation in water activities or provide baby sitting services if your were admitted for syncope or siezures until you have seen by  Primary MD or a Neurologist and advised to do so again.  Do not drive when taking Pain medications.    Do not take more than prescribed Pain, Sleep and Anxiety Medications  Special Instructions: If you have smoked or chewed Tobacco  in the last 2 yrs please stop smoking, stop any regular Alcohol  and or any Recreational drug use.  Wear Seat belts while driving.   Please note  You were cared for by a hospitalist during your hospital stay. If you have any questions about your discharge medications or the care you received while you were in the hospital after you are discharged, you can call the unit and asked to speak with the hospitalist on call if the hospitalist that took care of you is not available. Once you are discharged, your primary care physician will handle any further medical issues. Please note that NO REFILLS for any discharge medications will be authorized once you are discharged, as it is imperative that you return to your primary care physician (or establish a relationship with a primary care physician if you do not have one) for your aftercare needs so that they can reassess your need for medications and monitor your lab values.    Metformin and X-ray Contrast Studies For some X-ray exams, a contrast dye is used. Contrast dye is a type of medicine used to make the X-ray image clearer. The contrast dye is given to the patient through a vein (intravenously). If you need to have this type of X-ray exam and you take a medication called metformin, your caregiver  may have you stop taking metformin before the exam.  LACTIC ACIDOSIS In rare cases, a serious medical condition called lactic acidosis can develop in people who take metformin and receive contrast dye. The following conditions can increase the risk of this complication:   Kidney failure.  Liver problems.  Certain types of heart problems such as:  Heart failure.  Heart attack.  Heart infection.  Heart valve  problems.  Alcohol abuse. If left untreated, lactic acidosis can lead to coma.  SYMPTOMS OF LACTIC ACIDOSIS Symptoms of lactic acidosis can include:  Rapid breathing (hyperventilation).  Neurologic symptoms such as:  Headaches.  Confusion.  Dizziness.  Excessive sweating.  Feeling sick to your stomach (nauseous) or throwing up (vomiting). AFTER THE X-RAY EXAM  Stay well-hydrated. Drink fluids as instructed by your caregiver.  If you have a risk of developing lactic acidosis, blood tests may be done to make sure your kidney function is okay.  Metformin is usually stopped for 48 hours after the X-ray exam. Ask your caregiver when you can start taking metformin again. SEEK MEDICAL CARE IF:   You have shortness of breath or difficulty breathing.  You develop a headache that does not go away.  You have nausea or vomiting.  You urinate more than normal.  You develop a skin rash and have:  Redness.  Swelling.  Itching. Document Released: 03/12/2009 Document Revised: 06/16/2011 Document Reviewed: 03/12/2009 [facility] Patient Information 2015 [place], [place]. This information is not intended to replace advice given to you by your health care provider. Make sure you discuss any questions you have with your health care provider.

## 2013-12-14 NOTE — Discharge Summary (Signed)
Jared Santos, is a 79 y.o. male  DOB Jun 03, 1934  MRN [id number].  Admission date:  12/10/2013  Admitting Physician  Reyne Dumas, MD  Discharge Date:  12/14/2013   Primary MD  Maggie Font, MD  Recommendations for primary care physician for things to follow:   Needs prompt follow up with cardiologist within a week for a reduced EF of 40-45%, EF was around 55% 1-1/2 years ago.  Repeat 2 view chest x-ray in a week. If scarring opacity persists on the right side outpatient pulmonary followup.   We'll request PCP to monitor BMP weight in diuretic dose closely.   Admission Diagnosis  UTI (lower urinary tract infection) [599.0] Hypoxia [799.02] CAP (community acquired pneumonia) [486]   Discharge Diagnosis  UTI (lower urinary tract infection) [599.0] Hypoxia [799.02] CAP (community acquired pneumonia) [486]    Active Problems:   DM   HYPERLIPIDEMIA   Essential hypertension, benign   CORONARY ATHEROSCLEROSIS, NATIVE VESSEL   Ischemic cardiomyopathy   COPD with emphysema   Chronic systolic heart failure   Chronic respiratory failure with hypoxia   CAP (community acquired pneumonia)      Past Medical History  Diagnosis Date  . Cardiomyopathy secondary     LVEF 60-65% 9/12, LVEF 43% by Myoview 11/12  . Coronary atherosclerosis of native coronary artery     s/p MI in 2005, s/p DES to RCA in 2005;  s/p CABG in 3/09: L-LAD, S-Dx, S-OM, S-dRCA/PDA  . Type 2 diabetes mellitus   . Atrial flutter     s/p RFCA 2005  . Essential hypertension, benign   . Cerebral aneurysm     s/p clipping   . Mixed hyperlipidemia   . Atrial fibrillation   . Carotid stenosis     h/o occluded RICA  . COPD (chronic obstructive pulmonary disease)   . Lung nodule   . History of tuberculosis     1971  . Diabetes mellitus   . OSA (obstructive  sleep apnea) 01/01/2011  . Hypoxemia 03/13/2011    Past Surgical History  Procedure Laterality Date  . Coronary artery bypass graft  2005    LIMA to LAD, SVG to diagonal, SVG to OM, SVG to RCA and PDA       History of present illness and  Hospital Course:     Kindly see H&P for history of present illness and admission details, please review complete Labs, Consult reports and Test reports for all details in brief  HPI  from the history and physical done on the day of admission   79 year old male with a history of diabetes, coronary artery disease, status post PCI and CABG, atrial fibrillation, COPD, hypertension presented to the ER with shortness of breath and dysuria. Shortness of breath started one week ago it is associated with pleuritic chest pain. Patient was found to be percent on room air 88% on 4 L of oxygen. He has had a productive cough with brown sputum and also has experienced some worsening dependent edema in the last  one week. He denies any weight gain or weight loss. He denies any subjective fevers. He denies any nausea vomiting or diarrhea. He wears 3 L of oxygen at night. Was found to have a urinary tract infection, pro BNP 6470, troponin negative Is currently on 50% FiO2 Ventimask and is being admitted to step down . Chest x-ray shows right upper lobe airspace disease     Hospital Course   Acute on chronic Hypoxemic respiratory failure  Secondary to COPD exacerbation and combined acute on chronic systolic and diastolic heart failure along with CAP  Was treated with IV Lasix, IV steroids and antibiotics with good results. He is chronically on 06/07/1936 to nasal cannula oxygen with advanced COPD and CHF. He is currently close to baseline and will be placed on oral steroid taper, albuterol nebulizer treatments along with Spiriva, oral antibiotic for 5 more days, increase dose Lasix with close outpatient monitoring by PCP, his cardiologist and pulmonary physician available  locally in [place]. He needs repeat 2 view chest x-ray.  Note patient uses 3-4 L nasal cannula oxygen at baseline which will be continued. He's currently symptom-free on ambulation and eager to be discharged home. Ambulated > 50 yds without shortness of breath or chest pain, used a 3 L nasal cannula oxygen with pulse ox above 94%.   History of Cardiomyopathy with acute on chronic combined systolic and diastolic heart failure EF 40-45%  Was treated with IV Lasix, currently clinically close to compensation with no rales or edema, uses 4 L nasal cannula oxygen at home which will be continued. His home dose Lasix has been bumped and he has been placed on low-dose ACE inhibitor along with potassium supplementation. We'll request PCP to monitor BMP weight in diuretic dose closely. His EF has gone down from 54% to 40-45% in 1-1/2 years. Will request outpatient cardiology followup within a week in [place].   Coronary artery disease-cardiac enzymes negative, EKG nonacute with old right bundle branch block, chest pain-free, no exertional chest pain or shortness of breath, continue aspirin, statin, imdur, Coreg, added ACE. Patient in PCP both to ensure outpatient followup with cardiology within a week.   Type 2 diabetes-resume home oral regimen, A1c was 8.3. We'll request PCP to monitor I symmetrical and A1c closely and adjust medications as needed.    Essential hypertension-continue Coreg     Hyperlipidemia-continue Zocor     Urinary tract infection  Treated.    Discharge Condition: stable   Follow UP  Follow-up Information   Follow up with HILL,GERALD K, MD. Schedule an appointment as soon as possible for a visit in 1 week. (Primary care physician.)    Specialty:  Family Medicine   Contact information:   [address] [place] [address] [phone]       Follow up with Kate Sable A, MD. Schedule an appointment as soon as possible for a visit in 3 days.  (Cardiology. For reduced EF.)    Specialty:  Cardiology   Contact information:   [place] [address] [phone]       Follow up with HAWKINS,EDWARD L, MD. Schedule an appointment as soon as possible for a visit in 1 week. (Lung Dr. 4? lung nodule/scar)    Specialty:  Pulmonary Disease   Contact information:   [place]  [address] [phone]         Discharge Instructions  and  Discharge Medications  Discharge Instructions   Discharge instructions    Complete by:  As directed   Follow with Primary MD HILL,GERALD K, MD in 7 days   Get CBC, CMP, 2 view Chest X ray checked  by Primary MD next visit.    Activity: As tolerated with Full fall precautions use walker/cane & assistance as needed   Disposition Home     Diet: Heart Healthy - Low carb  Accuchecks 4 times/day, Once in AM empty stomach and then before each meal. Log in all results and show them to your Prim.MD in 3 days. If any glucose reading is under 80 or above 300 call your Prim MD immidiately. Follow Low glucose instructions for glucose under 80 as instructed.    For Heart failure patients - Check your Weight same time everyday, if you gain over 2 pounds, or you develop in leg swelling, experience more shortness of breath or chest pain, call your Primary MD immediately. Follow Cardiac Low Salt Diet and 1.8 lit/day fluid restriction.   On your next visit with her primary care physician please Get Medicines reviewed and adjusted.  Please request your Prim.MD to go over all Hospital Tests and Procedure/Radiological results at the follow up, please get all Hospital records sent to your Prim MD by signing hospital release before you go home.   If you experience worsening of your admission symptoms, develop shortness of breath, life threatening emergency, suicidal or homicidal thoughts you must seek medical attention immediately by calling 911 or calling  your MD immediately  if symptoms less severe.  You Must read complete instructions/literature along with all the possible adverse reactions/side effects for all the Medicines you take and that have been prescribed to you. Take any new Medicines after you have completely understood and accpet all the possible adverse reactions/side effects.   Do not drive, operating heavy machinery, perform activities at heights, swimming or participation in water activities or provide baby sitting services if your were admitted for syncope or siezures until you have seen by Primary MD or a Neurologist and advised to do so again.  Do not drive when taking Pain medications.    Do not take more than prescribed Pain, Sleep and Anxiety Medications  Special Instructions: If you have smoked or chewed Tobacco  in the last 2 yrs please stop smoking, stop any regular Alcohol  and or any Recreational drug use.  Wear Seat belts while driving.   Please note  You were cared for by a hospitalist during your hospital stay. If you have any questions about your discharge medications or the care you received while you were in the hospital after you are discharged, you can call the unit and asked to speak with the hospitalist on call if the hospitalist that took care of you is not available. Once you are discharged, your primary care physician will handle any further medical issues. Please note that NO REFILLS for any discharge medications will be authorized once you are discharged, as it is imperative that you return to your primary care physician (or establish a relationship with a primary care physician if you do not have one) for your aftercare needs so that they can reassess your need for medications and monitor your lab values.     Increase activity slowly    Complete by:  As directed             Medication List         acetaminophen 500 MG tablet  Commonly  known as:  TYLENOL  Take 500 mg by mouth every 4 (four)  hours as needed for moderate pain.     albuterol 108 (90 BASE) MCG/ACT inhaler  Commonly known as:  PROVENTIL HFA;VENTOLIN HFA  Inhale 3 puffs into the lungs every 6 (six) hours as needed for wheezing or shortness of breath.     aspirin 81 MG tablet  Take 81 mg by mouth daily.     carvedilol 12.5 MG tablet  Commonly known as:  COREG  Take 1 tablet (12.5 mg total) by mouth 2 (two) times daily with a meal.     furosemide 40 MG tablet  Commonly known as:  LASIX  Take 1 tablet (40 mg total) by mouth 2 (two) times daily.     glipiZIDE 10 MG tablet  Commonly known as:  GLUCOTROL  Take 10 mg by mouth 2 (two) times daily before a meal.     isosorbide mononitrate 30 MG 24 hr tablet  Commonly known as:  IMDUR  Take 30 mg by mouth 2 (two) times daily.     levofloxacin 500 MG tablet  Commonly known as:  LEVAQUIN  Take 1 tablet (500 mg total) by mouth daily.     lisinopril 2.5 MG tablet  Commonly known as:  PRINIVIL  Take 1 tablet (2.5 mg total) by mouth daily.     metFORMIN 1000 MG tablet  Commonly known as:  GLUCOPHAGE  Take 1,000 mg by mouth 2 (two) times daily with a meal.     potassium chloride 10 MEQ tablet  Commonly known as:  K-DUR  Take 1 tablet (10 mEq total) by mouth daily.     predniSONE 5 MG tablet  Commonly known as:  DELTASONE  Label  & dispense according to the schedule below.  6 Pills PO for 3 days, 4 Pills PO for 3 days, 2 Pills PO for 3 days, 1 Pills PO for 3 days, 1/2 Pill  PO for 3 days then STOP.     ranitidine 150 MG capsule  Commonly known as:  ZANTAC  Take 150 mg by mouth at bedtime as needed for heartburn.     simvastatin 20 MG tablet  Commonly known as:  ZOCOR  Take 1 tablet (20 mg total) by mouth at bedtime.     SYSTANE OP  Place 1 drop into both eyes daily as needed (for burning eyes).     tiotropium 18 MCG inhalation capsule  Commonly known as:  SPIRIVA HANDIHALER  Place 1 capsule (18 mcg total) into inhaler and inhale daily.           Diet and Activity recommendation: See Discharge Instructions above   Consults obtained -  None   Major procedures and Radiology Reports - PLEASE review detailed and final reports for all details, in brief -   Echo   - Left ventricle: The cavity size was normal. Wall thickness was increased in a pattern of moderate LVH. Systolic function was mildly to moderately reduced. The estimated ejection fraction was in the range of 40% to 45%. There is hypokinesis of the inferior myocardium. Doppler parameters are consistent with abnormal left ventricular relaxation (grade 1 diastolic dysfunction). Doppler parameters are consistent with high ventricular filling pressure. - Aortic valve: There was mild regurgitation. - Left atrium: The atrium was mildly dilated. - Right ventricle: The cavity size was mildly dilated. Systolic function was severely reduced. - Right atrium: The atrium was mildly dilated. - Atrial septum: The septum bowed from right  to left, consistent with increased right atrial pressure. - Tricuspid valve: There was moderate regurgitation. - Pulmonary arteries: Systolic pressure was moderately to severely increased. PA peak pressure: 53 mm Hg (S).  Impressions:  - Inferior hypokinesis with mild to moderate reduction in LV function; grade 1 diastolic dysfunction; mild RAE and RVE; moderate TR; severely reduced RV function; moderate to severe elevation in pulmonary pressures; mild AI.   Dg Chest 2 View  12/10/2013   CLINICAL DATA:  Short of breath. Cough. Prior CABG. Hypertension. Diabetes.  EXAM: CHEST  2 VIEW  COMPARISON:  12/24/2010  FINDINGS: Lateral view degraded by patient arm position. Prior median sternotomy. Midline trachea. Mild cardiomegaly with a tortuous thoracic aorta. No pleural effusion or pneumothorax. subtle right upper lobe airspace disease is suspected laterally. Most apparent on the frontal radiograph.  IMPRESSION: Suspicion of right upper lobe airspace  disease/pneumonia. This is subtle and warrants followup.  Cardiomegaly without congestive failure.   Electronically Signed   By: Abigail Miyamoto M.D.   On: 12/10/2013 14:09   Ct Angio Chest Pe W/cm &/or Wo Cm  12/10/2013   ADDENDUM REPORT: 12/10/2013 20:14  ADDENDUM: Mild enlargement of the main pulmonary artery, which can be seen in the setting of pulmonary arterial hypertension.   Electronically Signed   By: Logan Bores   On: 12/10/2013 20:14   12/10/2013   CLINICAL DATA:  Shortness of breath and decreased oxygen saturation. Rule out malignancy or pulmonary embolism.  EXAM: CT ANGIOGRAPHY CHEST WITH CONTRAST  TECHNIQUE: Multidetector CT imaging of the chest was performed using the standard protocol during bolus administration of intravenous contrast. Multiplanar CT image reconstructions and MIPs were obtained to evaluate the vascular anatomy.  CONTRAST:  98mL OMNIPAQUE IOHEXOL 350 MG/ML SOLN  COMPARISON:  Chest radiographs earlier today.  Chest CT 09/16/2011.  FINDINGS: The main pulmonary artery is mildly enlarged, measuring 3.5 cm, similar to the prior CT. No filling defects suggestive of pulmonary emboli are identified. Extensive atherosclerotic calcification is noted involving the thoracic aorta and coronary arteries. Heart is mildly enlarged. Sequelae of prior CABG are identified.  No enlarged axillary, mediastinal, or hilar lymph nodes are identified. No pleural or pericardial effusion is present. Evaluation of the lung parenchyma is mildly limited by respiratory motion artifact. There is centrilobular emphysema and biapical scarring. There is mild dependent subsegmental atelectasis involving the right greater than left lower lobes and posterior right upper lobe. No confluent opacity suggestive of pneumonia is identified. Mild right lower lobe scarring is again seen. Airways are patent.  The visualized portion of the upper abdomen is unremarkable. 2.5 cm lipoma is again seen in the right subscapularis muscle.  Mild-to-moderate thoracic spondylosis is noted.  Review of the MIP images confirms the above findings.  IMPRESSION: 1. No evidence of acute pulmonary emboli. 2. Centrilobular emphysema.  No mass.  Electronically Signed: By: Logan Bores On: 12/10/2013 18:47    Micro Results      Recent Results (from the past 240 hour(s))  MRSA PCR SCREENING     Status: None   Collection Time    12/10/13  8:08 PM      Result Value Ref Range Status   MRSA by PCR NEGATIVE  NEGATIVE Final   Comment:            The GeneXpert MRSA Assay (FDA     approved for NASAL specimens     only), is one component of a     comprehensive MRSA colonization  surveillance program. It is not     intended to diagnose MRSA     infection nor to guide or     monitor treatment for     MRSA infections.       Today   Subjective:   Cartier Washko today has no headache,no chest abdominal pain,no new weakness tingling or numbness, feels much better wants to go home today.    Objective:   Blood pressure 136/60, pulse 51, temperature 97.6 F (36.4 C), temperature source Oral, resp. rate 20, height 5\' 11"  (1.803 m), weight 83.416 kg (183 lb 14.4 oz), SpO2 96.00%.   Intake/Output Summary (Last 24 hours) at 12/14/13 1241 Last data filed at 12/14/13 1125  Gross per 24 hour  Intake   1360 ml  Output   1820 ml  Net   -460 ml    Exam Awake Alert, Oriented x 3, No new F.N deficits, Normal affect [place].AT,PERRAL Supple Neck,No JVD, No cervical lymphadenopathy appriciated.  Symmetrical Chest wall movement, Good air movement bilaterally, CTAB RRR,No Gallops,Rubs or new Murmurs, No Parasternal Heave +ve B.Sounds, Abd Soft, Non tender, No organomegaly appriciated, No rebound -guarding or rigidity. No Cyanosis, Clubbing or edema, No new Rash or bruise  Data Review   CBC w Diff: Lab Results  Component Value Date   WBC 11.5* 12/13/2013   HGB 15.3 12/13/2013   HCT 44.9 12/13/2013   PLT 102* 12/13/2013   LYMPHOPCT 9* 12/10/2013    MONOPCT 9 12/10/2013   EOSPCT 0 12/10/2013   BASOPCT 0 12/10/2013    CMP: Lab Results  Component Value Date   NA 135* 12/14/2013   K 4.4 12/14/2013   CL 100 12/14/2013   CO2 21 12/14/2013   BUN 40* 12/14/2013   CREATININE 1.14 12/14/2013   CREATININE 1.19 03/17/2011   PROT 6.3 12/14/2013   ALBUMIN 2.6* 12/14/2013   BILITOT 0.8 12/14/2013   ALKPHOS 87 12/14/2013   AST 37 12/14/2013   ALT 44 12/14/2013  .   Total Time in preparing paper work, data evaluation and todays exam - 35 minutes  Thurnell Lose M.D on 12/14/2013 at 12:41 PM  [facility] Office  [phone]   **Disclaimer: This note may have been dictated with voice recognition software. Similar sounding words can inadvertently be transcribed and this note may contain transcription errors which may not have been corrected upon publication of note.**

## 2013-12-21 NOTE — Progress Notes (Signed)
HPI: Mr. Stickels is a 79 year old patient of Dr. Domenic Polite, that we follow for ongoing assessment and management of CAD, status post coronary artery bypass grafting in March of 2009, he also has a drug-eluting stent. The right coronary artery placed in 2005, secondary cardiomyopathy, with most recent LVEF of 60, 65%, hypertension, atrial flutter, status post RF CA in 2005.   The patient was last seen by Dr. Domenic Polite in February 2015. At that time. The patient was symptomatically stable, there were no changes in his medication regimen. He is here for six-month followup.  Unfortunately, the patient has been admitted to a hospital in the setting of pneumonia, uncontrolled diabetes, and has had a echocardiogram during hospitalization ranged in dealing his EF reduced to 40-45% from 55% 18 months prior. The patient was also having some fluid retention. He was seen by his primary care physician, Dr. Berdine Addison, who had asked him to take extra doses of Lasix for the fluid retention. When he went home. The patient did that and did have some relief from the edema.  He has significant confusion concerning what medications he needs to be on, what the medications are for. The patient was also to continue on antibiotic therapy and steroid therapy. On discharge, but did not do so as he was not told what the medications were 4 and therefore did not feel comfortable taking them. He does have a followup appointment with Dr. Luan Pulling, pulmonologist, to be est. in [place] for ongoing assessment of pulmonary status. He use to follow Dr. Halford Chessman, but wishes to stay closer to home now. He is oxygen dependent.  No Known Allergies  Current Outpatient Prescriptions  Medication Sig Dispense Refill  . acetaminophen (TYLENOL) 500 MG tablet Take 500 mg by mouth every 4 (four) hours as needed for moderate pain.       Marland Kitchen albuterol (PROVENTIL HFA;VENTOLIN HFA) 108 (90 BASE) MCG/ACT inhaler Inhale 3 puffs into the lungs every 6 (six) hours  as needed for wheezing or shortness of breath.      Marland Kitchen aspirin 81 MG tablet Take 81 mg by mouth daily.      . carvedilol (COREG) 12.5 MG tablet Take 1 tablet (12.5 mg total) by mouth 2 (two) times daily with a meal.  60 tablet  5  . furosemide (LASIX) 20 MG tablet Take 1 tablet (20 mg total) by mouth 2 (two) times daily.  90 tablet  3  . glipiZIDE (GLUCOTROL) 10 MG tablet Take 10 mg by mouth 2 (two) times daily before a meal.        . isosorbide mononitrate (IMDUR) 30 MG 24 hr tablet Take 30 mg by mouth 2 (two) times daily.      Marland Kitchen lisinopril (PRINIVIL) 2.5 MG tablet Take 1 tablet (2.5 mg total) by mouth daily.  5 tablet  0  . metFORMIN (GLUCOPHAGE) 1000 MG tablet Take 1,000 mg by mouth 2 (two) times daily with a meal.        . Polyethyl Glycol-Propyl Glycol (SYSTANE OP) Place 1 drop into both eyes daily as needed (for burning eyes).      . potassium chloride (K-DUR) 10 MEQ tablet Take 1 tablet (10 mEq total) by mouth daily.  30 tablet  0  . ranitidine (ZANTAC) 150 MG capsule Take 150 mg by mouth at bedtime as needed for heartburn.       . simvastatin (ZOCOR) 20 MG tablet Take 1 tablet (20 mg total) by mouth at bedtime.  30 tablet  12  . tiotropium (SPIRIVA HANDIHALER) 18 MCG inhalation capsule Place 1 capsule (18 mcg total) into inhaler and inhale daily.  30 capsule  12   No current facility-administered medications for this visit.    Past Medical History  Diagnosis Date  . Cardiomyopathy secondary     LVEF 60-65% 9/12, LVEF 43% by Myoview 11/12  . Coronary atherosclerosis of native coronary artery     s/p MI in 2005, s/p DES to RCA in 2005;  s/p CABG in 3/09: L-LAD, S-Dx, S-OM, S-dRCA/PDA  . Type 2 diabetes mellitus   . Atrial flutter     s/p RFCA 2005  . Essential hypertension, benign   . Cerebral aneurysm     s/p clipping   . Mixed hyperlipidemia   . Atrial fibrillation   . Carotid stenosis     h/o occluded RICA  . COPD (chronic obstructive pulmonary disease)   . Lung nodule   .  History of tuberculosis     1971  . Diabetes mellitus   . OSA (obstructive sleep apnea) 01/01/2011  . Hypoxemia 03/13/2011    Past Surgical History  Procedure Laterality Date  . Coronary artery bypass graft  2005    LIMA to LAD, SVG to diagonal, SVG to OM, SVG to RCA and PDA    ROS: Review of systems complete and found to be negative unless listed above  PHYSICAL EXAM BP 142/64  Pulse 58  Ht 5\' 11"  (1.803 m)  Wt 178 lb (80.74 kg)  BMI 24.84 kg/m2  SpO2 99% General: Well developed, well nourished, in no acute distress, wearing oxygen via nasal cannula. Head: Eyes PERRLA, No xanthomas.   Normal cephalic and atramatic  Lungs: Clear bilaterally to auscultation and percussion. Heart: HRRR S1 S2, without MRG.  Pulses are 2+ & equal.            No carotid bruit. No JVD.  No abdominal bruits. No femoral bruits. Abdomen: Bowel sounds are positive, abdomen soft and non-tender without masses or                  Hernia's noted. Msk:  Back normal, normal gait. Normal strength and tone for age. Extremities: No clubbing, cyanosis or edema.  DP +1 Neuro: Alert and oriented X 3. Psych:  Good affect, responds appropriately   ASSESSMENT AND PLAN

## 2013-12-22 ENCOUNTER — Encounter: Payer: Self-pay | Admitting: Adult Health

## 2013-12-22 ENCOUNTER — Ambulatory Visit (INDEPENDENT_AMBULATORY_CARE_PROVIDER_SITE_OTHER): Payer: Medicare Other | Admitting: Adult Health

## 2013-12-22 ENCOUNTER — Encounter: Payer: Medicare Other | Admitting: Adult Health

## 2013-12-22 VITALS — BP 142/64 | HR 58 | Ht 71.0 in | Wt 178.0 lb

## 2013-12-22 DIAGNOSIS — I2510 Atherosclerotic heart disease of native coronary artery without angina pectoris: Secondary | ICD-10-CM

## 2013-12-22 DIAGNOSIS — I5022 Chronic systolic (congestive) heart failure: Secondary | ICD-10-CM

## 2013-12-22 DIAGNOSIS — R931 Abnormal findings on diagnostic imaging of heart and coronary circulation: Secondary | ICD-10-CM

## 2013-12-22 DIAGNOSIS — R9389 Abnormal findings on diagnostic imaging of other specified body structures: Secondary | ICD-10-CM

## 2013-12-22 MED ORDER — FUROSEMIDE 20 MG PO TABS: 20.0000 mg | ORAL_TABLET | Freq: Two times a day (BID) | ORAL | Status: DC

## 2013-12-22 NOTE — Progress Notes (Deleted)
Name: Jared Santos    DOB: [birth year]  Age: 79 y.o.  MR#: [id number]       PCP:  Maggie Font, MD      Insurance: Payor: [employer] MEDICARE / Plan: AARP MEDICARE COMPLETE / Product Type: *No Product type* /   CC:    Chief Complaint  Patient presents with  . Coronary Artery Disease  . Hypertension    VS Filed Vitals:   12/22/13 1329  BP: 142/64  Pulse: 58  Height: 5\' 11"  (1.803 m)  Weight: 178 lb (80.74 kg)  SpO2: 99%    Weights Current Weight  12/22/13 178 lb (80.74 kg)  12/14/13 183 lb 14.4 oz (83.416 kg)  06/09/13 195 lb 9.6 oz (88.724 kg)    Blood Pressure  BP Readings from Last 3 Encounters:  12/22/13 142/64  12/14/13 136/60  06/09/13 136/70     Admit date:  (Not on file) Last encounter with RMR:  Visit date not found   Allergy Review of patient's allergies indicates no known allergies.  Current Outpatient Prescriptions  Medication Sig Dispense Refill  . acetaminophen (TYLENOL) 500 MG tablet Take 500 mg by mouth every 4 (four) hours as needed for moderate pain.       Marland Kitchen albuterol (PROVENTIL HFA;VENTOLIN HFA) 108 (90 BASE) MCG/ACT inhaler Inhale 3 puffs into the lungs every 6 (six) hours as needed for wheezing or shortness of breath.      Marland Kitchen aspirin 81 MG tablet Take 81 mg by mouth daily.      . carvedilol (COREG) 12.5 MG tablet Take 1 tablet (12.5 mg total) by mouth 2 (two) times daily with a meal.  60 tablet  5  . furosemide (LASIX) 40 MG tablet Take 1 tablet (40 mg total) by mouth 2 (two) times daily.  60 tablet  0  . glipiZIDE (GLUCOTROL) 10 MG tablet Take 10 mg by mouth 2 (two) times daily before a meal.        . isosorbide mononitrate (IMDUR) 30 MG 24 hr tablet Take 30 mg by mouth 2 (two) times daily.      Marland Kitchen levofloxacin (LEVAQUIN) 500 MG tablet Take 1 tablet (500 mg total) by mouth daily.  5 tablet  0  . lisinopril (PRINIVIL) 2.5 MG tablet Take 1 tablet (2.5 mg total) by mouth daily.  5 tablet  0  . metFORMIN (GLUCOPHAGE) 1000 MG tablet Take 1,000 mg  by mouth 2 (two) times daily with a meal.        . Polyethyl Glycol-Propyl Glycol (SYSTANE OP) Place 1 drop into both eyes daily as needed (for burning eyes).      . potassium chloride (K-DUR) 10 MEQ tablet Take 1 tablet (10 mEq total) by mouth daily.  30 tablet  0  . predniSONE (DELTASONE) 5 MG tablet Label  & dispense according to the schedule below.  6 Pills PO for 3 days, 4 Pills PO for 3 days, 2 Pills PO for 3 days, 1 Pills PO for 3 days, 1/2 Pill  PO for 3 days then STOP.  45 tablet  0  . ranitidine (ZANTAC) 150 MG capsule Take 150 mg by mouth at bedtime as needed for heartburn.       . simvastatin (ZOCOR) 20 MG tablet Take 1 tablet (20 mg total) by mouth at bedtime.  30 tablet  12  . tiotropium (SPIRIVA HANDIHALER) 18 MCG inhalation capsule Place 1 capsule (18 mcg total) into inhaler and inhale daily.  30 capsule  12  No current facility-administered medications for this visit.    Discontinued Meds:   There are no discontinued medications.  Patient Active Problem List   Diagnosis Date Noted  . CAP (community acquired pneumonia) 12/10/2013  . Chronic respiratory failure with hypoxia 03/13/2011  . Chronic systolic heart failure 93/71/6967  . COPD with emphysema 02/19/2011  . OSA (obstructive sleep apnea) 01/01/2011  . DM 07/31/2009  . HYPERLIPIDEMIA 07/31/2009  . Essential hypertension, benign 07/31/2009  . CORONARY ATHEROSCLEROSIS, NATIVE VESSEL 07/31/2009  . Ischemic cardiomyopathy 07/31/2009  . CAROTID ARTERY DISEASE 07/31/2009    LABS    Component Value Date/Time   NA 135* 12/14/2013 0643   NA 136* 12/13/2013 0317   NA 135* 12/12/2013 0245   K 4.4 12/14/2013 0643   K 4.2 12/13/2013 0317   K 4.4 12/12/2013 0245   CL 100 12/14/2013 0643   CL 99 12/13/2013 0317   CL 100 12/12/2013 0245   CO2 21 12/14/2013 0643   CO2 25 12/13/2013 0317   CO2 22 12/12/2013 0245   GLUCOSE 225* 12/14/2013 0643   GLUCOSE 287* 12/13/2013 0317   GLUCOSE 275* 12/12/2013 0245   BUN 40* 12/14/2013 0643   BUN 36*  12/13/2013 0317   BUN 32* 12/12/2013 0245   CREATININE 1.14 12/14/2013 0643   CREATININE 1.13 12/13/2013 0317   CREATININE 1.07 12/12/2013 0245   CREATININE 1.19 03/17/2011 1002   CREATININE 1.31 01/15/2011 1355   CREATININE 1.40* 01/06/2011 1320   CALCIUM 7.9* 12/14/2013 0643   CALCIUM 8.1* 12/13/2013 0317   CALCIUM 8.4 12/12/2013 0245   GFRNONAA 60* 12/14/2013 0643   GFRNONAA 61* 12/13/2013 0317   GFRNONAA 65* 12/12/2013 0245   GFRAA 70* 12/14/2013 0643   GFRAA 70* 12/13/2013 0317   GFRAA 75* 12/12/2013 0245   CMP     Component Value Date/Time   NA 135* 12/14/2013 0643   K 4.4 12/14/2013 0643   CL 100 12/14/2013 0643   CO2 21 12/14/2013 0643   GLUCOSE 225* 12/14/2013 0643   BUN 40* 12/14/2013 0643   CREATININE 1.14 12/14/2013 0643   CREATININE 1.19 03/17/2011 1002   CALCIUM 7.9* 12/14/2013 0643   PROT 6.3 12/14/2013 0643   ALBUMIN 2.6* 12/14/2013 0643   AST 37 12/14/2013 0643   ALT 44 12/14/2013 0643   ALKPHOS 87 12/14/2013 0643   BILITOT 0.8 12/14/2013 0643   GFRNONAA 60* 12/14/2013 0643   GFRAA 70* 12/14/2013 0643       Component Value Date/Time   WBC 11.5* 12/13/2013 0317   WBC 11.6* 12/12/2013 0245   WBC 11.2* 12/11/2013 0319   HGB 15.3 12/13/2013 0317   HGB 15.0 12/12/2013 0245   HGB 14.3 12/11/2013 0319   HCT 44.9 12/13/2013 0317   HCT 44.1 12/12/2013 0245   HCT 41.9 12/11/2013 0319   MCV 84.4 12/13/2013 0317   MCV 83.7 12/12/2013 0245   MCV 82.5 12/11/2013 0319    Lipid Panel     Component Value Date/Time   CHOL 136 12/25/2010 0720   TRIG 117 12/25/2010 0720   HDL 42 12/25/2010 0720   CHOLHDL 3.2 12/25/2010 0720   VLDL 23 12/25/2010 0720   LDLCALC 71 12/25/2010 0720    ABG    Component Value Date/Time   PHART 7.409 06/28/2007 2107   PCO2ART 37.8 06/28/2007 2107   PO2ART 62.0* 06/28/2007 2107   HCO3 24.3* 06/29/2007 1702   TCO2 23 12/24/2010 2103   ACIDBASEDEF 1.0 06/29/2007 1702   O2SAT 91.0 06/28/2007 2107  Lab Results  Component Value Date   TSH 0.930 12/10/2013   BNP (last 3 results)  Recent Labs  12/10/13 1320  12/11/13 0319  PROBNP 6470.0* 7375.0*   Cardiac Panel (last 3 results) No results found for this basename: CKTOTAL, CKMB, TROPONINI, RELINDX,  in the last 72 hours  Iron/TIBC/Ferritin/ %Sat No results found for this basename: iron, tibc, ferritin, ironpctsat     EKG Orders placed during the hospital encounter of 12/10/13  . EKG 12-LEAD  . EKG 12-LEAD  . ED EKG  . ED EKG  . EKG 12-LEAD  . EKG 12-LEAD  . EKG     Prior Assessment and Plan Problem List as of 12/22/2013     Cardiovascular and Mediastinum   Essential hypertension, benign   Last Assessment & Plan   05/10/2013 Office Visit Written 05/10/2013  9:12 AM by Satira Sark, MD     Continue current regimen.    CORONARY ATHEROSCLEROSIS, NATIVE VESSEL   Last Assessment & Plan   05/10/2013 Office Visit Written 05/10/2013  9:10 AM by Satira Sark, MD     Symptomatically stable on medical therapy. ECG reviewed. Continue walking for exercise. No change in current regimen. Will keep an annual followup, although can see him sooner if symptoms escalate.    Ischemic cardiomyopathy   Last Assessment & Plan   05/10/2013 Office Visit Written 05/10/2013  9:11 AM by Satira Sark, MD     LVEF improved to 55% by echocardiogram last year. Continue medical therapy.    CAROTID ARTERY DISEASE   Chronic systolic heart failure   Last Assessment & Plan   05/10/2012 Office Visit Written 05/10/2012  9:20 AM by Satira Sark, MD     Weight has increased, although he denies any leg edema, also states that he has been eating a snack in the early morning hours.   No quantification of LVEF since prior Myoview in 2012. An echocardiogram will be obtained to ensure stability.      Respiratory   OSA (obstructive sleep apnea)   Last Assessment & Plan   03/14/2013 Office Visit Written 03/17/2013  5:51 PM by Chesley Mires, MD     Most recent sleep study from July 2014 did not show sleep apnea.    COPD with emphysema   Last Assessment & Plan    05/10/2013 Office Visit Written 05/10/2013  9:11 AM by Satira Sark, MD     Continue followup with Dr. Halford Chessman.    Chronic respiratory failure with hypoxia   Last Assessment & Plan   03/14/2013 Office Visit Written 03/17/2013  5:51 PM by Chesley Mires, MD     He is to continue 3 liters oxygen.    CAP (community acquired pneumonia)     Endocrine   DM     Other   HYPERLIPIDEMIA   Last Assessment & Plan   05/10/2013 Office Visit Written 05/10/2013  9:13 AM by Satira Sark, MD     Continues on Zocor. Keep follow up lipids with Dr. Berdine Addison.        Imaging: Dg Chest 2 View  12/10/2013   CLINICAL DATA:  Short of breath. Cough. Prior CABG. Hypertension. Diabetes.  EXAM: CHEST  2 VIEW  COMPARISON:  12/24/2010  FINDINGS: Lateral view degraded by patient arm position. Prior median sternotomy. Midline trachea. Mild cardiomegaly with a tortuous thoracic aorta. No pleural effusion or pneumothorax. subtle right upper lobe airspace disease is suspected laterally. Most apparent on the frontal radiograph.  IMPRESSION: Suspicion of right upper lobe airspace disease/pneumonia. This is subtle and warrants followup.  Cardiomegaly without congestive failure.   Electronically Signed   By: Abigail Miyamoto M.D.   On: 12/10/2013 14:09   Ct Angio Chest Pe W/cm &/or Wo Cm  12/10/2013   ADDENDUM REPORT: 12/10/2013 20:14  ADDENDUM: Mild enlargement of the main pulmonary artery, which can be seen in the setting of pulmonary arterial hypertension.   Electronically Signed   By: Logan Bores   On: 12/10/2013 20:14   12/10/2013   CLINICAL DATA:  Shortness of breath and decreased oxygen saturation. Rule out malignancy or pulmonary embolism.  EXAM: CT ANGIOGRAPHY CHEST WITH CONTRAST  TECHNIQUE: Multidetector CT imaging of the chest was performed using the standard protocol during bolus administration of intravenous contrast. Multiplanar CT image reconstructions and MIPs were obtained to evaluate the vascular anatomy.  CONTRAST:  22mL  OMNIPAQUE IOHEXOL 350 MG/ML SOLN  COMPARISON:  Chest radiographs earlier today.  Chest CT 09/16/2011.  FINDINGS: The main pulmonary artery is mildly enlarged, measuring 3.5 cm, similar to the prior CT. No filling defects suggestive of pulmonary emboli are identified. Extensive atherosclerotic calcification is noted involving the thoracic aorta and coronary arteries. Heart is mildly enlarged. Sequelae of prior CABG are identified.  No enlarged axillary, mediastinal, or hilar lymph nodes are identified. No pleural or pericardial effusion is present. Evaluation of the lung parenchyma is mildly limited by respiratory motion artifact. There is centrilobular emphysema and biapical scarring. There is mild dependent subsegmental atelectasis involving the right greater than left lower lobes and posterior right upper lobe. No confluent opacity suggestive of pneumonia is identified. Mild right lower lobe scarring is again seen. Airways are patent.  The visualized portion of the upper abdomen is unremarkable. 2.5 cm lipoma is again seen in the right subscapularis muscle. Mild-to-moderate thoracic spondylosis is noted.  Review of the MIP images confirms the above findings.  IMPRESSION: 1. No evidence of acute pulmonary emboli. 2. Centrilobular emphysema.  No mass.  Electronically Signed: By: Logan Bores On: 12/10/2013 18:47

## 2013-12-22 NOTE — Patient Instructions (Signed)
Your physician recommends that you schedule a follow-up appointment in: 1 week   Your physician has recommended you make the following change in your medication:     Take Lasix 20 mg twice a day    Please weigh self daily and record on wt log   Please follow 2 GM sodium diet    Your physician has requested that you have a lexiscan myoview. For further information please visit [email or web address]. Please follow instruction sheet, as given.      Thank you for choosing [facility] !

## 2013-12-22 NOTE — Assessment & Plan Note (Signed)
He has been advised to followup with Dr. Luan Pulling as directed. The patient will transfer his records from pulmonologist in [place] to Dr. Luan Pulling. Prior to that visit.

## 2013-12-22 NOTE — Assessment & Plan Note (Signed)
The patient denies any complaints of chest pain. I will continue him on carvedilol 12.5 mg twice a day, his statin therapy, along with indoor. As stated, I will plan a Lexiscan Cardiolite for further evaluation of progressive CAD with known history of drug-eluting stents. The right coronary artery, CABG in 2009.

## 2013-12-22 NOTE — Assessment & Plan Note (Signed)
Review of his recent hospitalization notes state that his ejection fraction has been reduced to 45%. I have discussed with him at length. His medications, reason for taking them, and also marking the bottles. The patient has been placed on a 2 g sodium diet, he is to continue taking Lasix 20 mg by mouth twice a day, he will weigh himself daily on a digital scale.  I am Dr. Myles Gip most recent office note, it is noted that he had an EF of 55% in February 2014, but with reduction of his LV function, I will schedule a Lexiscan  Cardiolite to evaluate for evidence of ischemia causing reduction in heart function.

## 2013-12-30 ENCOUNTER — Encounter (HOSPITAL_COMMUNITY)
Admission: RE | Admit: 2013-12-30 | Discharge: 2013-12-30 | Disposition: A | Discharge status: Home / Self Care | Payer: Medicare Other | Source: Ambulatory Visit | Attending: Adult Health | Admitting: Adult Health

## 2013-12-30 ENCOUNTER — Encounter (HOSPITAL_COMMUNITY): Payer: Self-pay

## 2013-12-30 ENCOUNTER — Ambulatory Visit (HOSPITAL_COMMUNITY)
Admission: RE | Admit: 2013-12-30 | Discharge: 2013-12-30 | Disposition: A | Discharge status: Home / Self Care | Payer: Medicare Other | Source: Ambulatory Visit | Attending: Adult Health | Admitting: Adult Health

## 2013-12-30 DIAGNOSIS — R9389 Abnormal findings on diagnostic imaging of other specified body structures: Secondary | ICD-10-CM | POA: Diagnosis not present

## 2013-12-30 DIAGNOSIS — R0602 Shortness of breath: Secondary | ICD-10-CM | POA: Diagnosis not present

## 2013-12-30 DIAGNOSIS — I2510 Atherosclerotic heart disease of native coronary artery without angina pectoris: Secondary | ICD-10-CM

## 2013-12-30 DIAGNOSIS — R931 Abnormal findings on diagnostic imaging of heart and coronary circulation: Secondary | ICD-10-CM

## 2013-12-30 MED ORDER — TECHNETIUM TC 99M SESTAMIBI GENERIC - CARDIOLITE
30.0000 | Freq: Once | INTRAVENOUS | Status: AC | PRN
  Administered 2013-12-30: 30 via INTRAVENOUS

## 2013-12-30 MED ORDER — REGADENOSON 0.4 MG/5ML IV SOLN
INTRAVENOUS | Status: AC
  Administered 2013-12-30: 0.4 mg via INTRAVENOUS
  Filled 2013-12-30: qty 5

## 2013-12-30 MED ORDER — REGADENOSON 0.4 MG/5ML IV SOLN
0.4000 mg | Freq: Once | INTRAVENOUS | Status: AC
  Administered 2013-12-30: 0.4 mg via INTRAVENOUS

## 2013-12-30 MED ORDER — TECHNETIUM TC 99M SESTAMIBI - CARDIOLITE
10.0000 | Freq: Once | INTRAVENOUS | Status: AC | PRN
  Administered 2013-12-30: 10 via INTRAVENOUS

## 2013-12-30 MED ORDER — SODIUM CHLORIDE 0.9 % IJ SOLN
10.0000 mL | INTRAMUSCULAR | Status: DC | PRN
  Administered 2013-12-30: 10 mL via INTRAVENOUS

## 2013-12-30 NOTE — Progress Notes (Signed)
Stress Lab Nurses Notes - [place] 12/30/2013 Reason for doing test: SOB Type of test: Leane Call Nurse performing test: Boneta Lucks Nuclear Medicine Tech: Melburn Hake Echo Tech: Not Applicable MD performing test: Jory Sims Family MD: Dr Berdine Addison Test explained and consent signed: Yes.   IV started: Saline lock flushed and Saline lock started in radiology Symptoms: pressure in abdomen, light headed Treatment/Intervention: None Reason test stopped: protocol completed After recovery IV was: Discontinued via X-ray tech Patient to return to [facility]. Med at :1015 Patient discharged: Home Patient's Condition upon discharge was: stable Comments:  Pretest BP149/96 HR 71 Peak BP 165/60 HR 87,  symptoms resolved, pt eating and drinking Jared Santos, Jared Santos

## 2014-01-02 ENCOUNTER — Encounter (HOSPITAL_COMMUNITY): Payer: Medicare Other

## 2014-01-02 ENCOUNTER — Ambulatory Visit (HOSPITAL_COMMUNITY): Payer: Medicare Other

## 2014-01-03 ENCOUNTER — Encounter: Payer: Self-pay | Admitting: Adult Health

## 2014-01-03 ENCOUNTER — Ambulatory Visit (INDEPENDENT_AMBULATORY_CARE_PROVIDER_SITE_OTHER): Payer: Medicare Other | Admitting: Adult Health

## 2014-01-03 VITALS — BP 128/58 | HR 66 | Ht 71.0 in | Wt 179.0 lb

## 2014-01-03 DIAGNOSIS — I10 Essential (primary) hypertension: Secondary | ICD-10-CM

## 2014-01-03 DIAGNOSIS — I255 Ischemic cardiomyopathy: Secondary | ICD-10-CM

## 2014-01-03 DIAGNOSIS — I5022 Chronic systolic (congestive) heart failure: Secondary | ICD-10-CM

## 2014-01-03 DIAGNOSIS — I2589 Other forms of chronic ischemic heart disease: Secondary | ICD-10-CM

## 2014-01-03 NOTE — Assessment & Plan Note (Signed)
He appears well compensated today without evidence of fluid overload. He is to use daily weights and low sodium diet.

## 2014-01-03 NOTE — Progress Notes (Deleted)
Name: Jared Santos    DOB: 1934-04-29  Age: 79 y.o.  MR#: [id number]       PCP:  Maggie Font, MD      Insurance: Payor: [employer] MEDICARE / Plan: AARP MEDICARE COMPLETE / Product Type: *No Product type* /   CC:    Chief Complaint  Patient presents with  . Coronary Artery Disease    CABG and PCI  . Hypertension    VS Filed Vitals:   01/03/14 1312  BP: 128/58  Pulse: 66  Height: 5\' 11"  (1.803 m)  Weight: 179 lb (81.194 kg)    Weights Current Weight  01/03/14 179 lb (81.194 kg)  12/22/13 178 lb (80.74 kg)  12/14/13 183 lb 14.4 oz (83.416 kg)    Blood Pressure  BP Readings from Last 3 Encounters:  01/03/14 128/58  12/22/13 142/64  12/14/13 136/60     Admit date:  (Not on file) Last encounter with RMR:  12/22/2013   Allergy Review of patient's allergies indicates no known allergies.  Current Outpatient Prescriptions  Medication Sig Dispense Refill  . acetaminophen (TYLENOL) 500 MG tablet Take 500 mg by mouth every 4 (four) hours as needed for moderate pain.       Marland Kitchen albuterol (PROVENTIL HFA;VENTOLIN HFA) 108 (90 BASE) MCG/ACT inhaler Inhale 3 puffs into the lungs every 6 (six) hours as needed for wheezing or shortness of breath.      Marland Kitchen aspirin 81 MG tablet Take 81 mg by mouth daily.      . carvedilol (COREG) 12.5 MG tablet Take 1 tablet (12.5 mg total) by mouth 2 (two) times daily with a meal.  60 tablet  5  . furosemide (LASIX) 20 MG tablet Take 1 tablet (20 mg total) by mouth 2 (two) times daily.  90 tablet  3  . glipiZIDE (GLUCOTROL) 10 MG tablet Take 10 mg by mouth 2 (two) times daily before a meal.        . isosorbide mononitrate (IMDUR) 30 MG 24 hr tablet Take 30 mg by mouth 2 (two) times daily.      Marland Kitchen lisinopril (PRINIVIL) 2.5 MG tablet Take 1 tablet (2.5 mg total) by mouth daily.  5 tablet  0  . metFORMIN (GLUCOPHAGE) 1000 MG tablet Take 1,000 mg by mouth 2 (two) times daily with a meal.        . Polyethyl Glycol-Propyl Glycol (SYSTANE OP) Place 1 drop  into both eyes daily as needed (for burning eyes).      . potassium chloride (K-DUR) 10 MEQ tablet Take 1 tablet (10 mEq total) by mouth daily.  30 tablet  0  . ranitidine (ZANTAC) 150 MG capsule Take 150 mg by mouth at bedtime as needed for heartburn.       . simvastatin (ZOCOR) 20 MG tablet Take 1 tablet (20 mg total) by mouth at bedtime.  30 tablet  12  . tiotropium (SPIRIVA HANDIHALER) 18 MCG inhalation capsule Place 1 capsule (18 mcg total) into inhaler and inhale daily.  30 capsule  12   No current facility-administered medications for this visit.    Discontinued Meds:   There are no discontinued medications.  Patient Active Problem List   Diagnosis Date Noted  . CAP (community acquired pneumonia) 12/10/2013  . Chronic respiratory failure with hypoxia 03/13/2011  . Chronic systolic heart failure 29/51/8841  . COPD with emphysema 02/19/2011  . OSA (obstructive sleep apnea) 01/01/2011  . DM 07/31/2009  . HYPERLIPIDEMIA 07/31/2009  . Essential hypertension,  benign 07/31/2009  . CORONARY ATHEROSCLEROSIS, NATIVE VESSEL 07/31/2009  . Ischemic cardiomyopathy 07/31/2009  . CAROTID ARTERY DISEASE 07/31/2009    LABS    Component Value Date/Time   NA 135* 12/14/2013 0643   NA 136* 12/13/2013 0317   NA 135* 12/12/2013 0245   K 4.4 12/14/2013 0643   K 4.2 12/13/2013 0317   K 4.4 12/12/2013 0245   CL 100 12/14/2013 0643   CL 99 12/13/2013 0317   CL 100 12/12/2013 0245   CO2 21 12/14/2013 0643   CO2 25 12/13/2013 0317   CO2 22 12/12/2013 0245   GLUCOSE 225* 12/14/2013 0643   GLUCOSE 287* 12/13/2013 0317   GLUCOSE 275* 12/12/2013 0245   BUN 40* 12/14/2013 0643   BUN 36* 12/13/2013 0317   BUN 32* 12/12/2013 0245   CREATININE 1.14 12/14/2013 0643   CREATININE 1.13 12/13/2013 0317   CREATININE 1.07 12/12/2013 0245   CREATININE 1.19 03/17/2011 1002   CREATININE 1.31 01/15/2011 1355   CREATININE 1.40* 01/06/2011 1320   CALCIUM 7.9* 12/14/2013 0643   CALCIUM 8.1* 12/13/2013 0317   CALCIUM 8.4 12/12/2013 0245   GFRNONAA 60*  12/14/2013 0643   GFRNONAA 61* 12/13/2013 0317   GFRNONAA 65* 12/12/2013 0245   GFRAA 70* 12/14/2013 0643   GFRAA 70* 12/13/2013 0317   GFRAA 75* 12/12/2013 0245   CMP     Component Value Date/Time   NA 135* 12/14/2013 0643   K 4.4 12/14/2013 0643   CL 100 12/14/2013 0643   CO2 21 12/14/2013 0643   GLUCOSE 225* 12/14/2013 0643   BUN 40* 12/14/2013 0643   CREATININE 1.14 12/14/2013 0643   CREATININE 1.19 03/17/2011 1002   CALCIUM 7.9* 12/14/2013 0643   PROT 6.3 12/14/2013 0643   ALBUMIN 2.6* 12/14/2013 0643   AST 37 12/14/2013 0643   ALT 44 12/14/2013 0643   ALKPHOS 87 12/14/2013 0643   BILITOT 0.8 12/14/2013 0643   GFRNONAA 60* 12/14/2013 0643   GFRAA 70* 12/14/2013 0643       Component Value Date/Time   WBC 11.5* 12/13/2013 0317   WBC 11.6* 12/12/2013 0245   WBC 11.2* 12/11/2013 0319   HGB 15.3 12/13/2013 0317   HGB 15.0 12/12/2013 0245   HGB 14.3 12/11/2013 0319   HCT 44.9 12/13/2013 0317   HCT 44.1 12/12/2013 0245   HCT 41.9 12/11/2013 0319   MCV 84.4 12/13/2013 0317   MCV 83.7 12/12/2013 0245   MCV 82.5 12/11/2013 0319    Lipid Panel     Component Value Date/Time   CHOL 136 12/25/2010 0720   TRIG 117 12/25/2010 0720   HDL 42 12/25/2010 0720   CHOLHDL 3.2 12/25/2010 0720   VLDL 23 12/25/2010 0720   LDLCALC 71 12/25/2010 0720    ABG    Component Value Date/Time   PHART 7.409 06/28/2007 2107   PCO2ART 37.8 06/28/2007 2107   PO2ART 62.0* 06/28/2007 2107   HCO3 24.3* 06/29/2007 1702   TCO2 23 12/24/2010 2103   ACIDBASEDEF 1.0 06/29/2007 1702   O2SAT 91.0 06/28/2007 2107     Lab Results  Component Value Date   TSH 0.930 12/10/2013   BNP (last 3 results)  Recent Labs  12/10/13 1320 12/11/13 0319  PROBNP 6470.0* 7375.0*   Cardiac Panel (last 3 results) No results found for this basename: CKTOTAL, CKMB, TROPONINI, RELINDX,  in the last 72 hours  Iron/TIBC/Ferritin/ %Sat No results found for this basename: iron, tibc, ferritin, ironpctsat     EKG Orders placed during the  hospital encounter of 12/10/13  . EKG 12-LEAD   . EKG 12-LEAD  . ED EKG  . ED EKG  . EKG 12-LEAD  . EKG 12-LEAD  . EKG     Prior Assessment and Plan Problem List as of 01/03/2014     Cardiovascular and Mediastinum   Essential hypertension, benign   Last Assessment & Plan   05/10/2013 Office Visit Written 05/10/2013  9:12 AM by Satira Sark, MD     Continue current regimen.    CORONARY ATHEROSCLEROSIS, NATIVE VESSEL   Last Assessment & Plan   12/22/2013 Office Visit Written 12/22/2013  3:39 PM by Lendon Colonel, NP     The patient denies any complaints of chest pain. I will continue him on carvedilol 12.5 mg twice a day, his statin therapy, along with indoor. As stated, I will plan a Lexiscan Cardiolite for further evaluation of progressive CAD with known history of drug-eluting stents. The right coronary artery, CABG in 2009.    Ischemic cardiomyopathy   Last Assessment & Plan   05/10/2013 Office Visit Written 05/10/2013  9:11 AM by Satira Sark, MD     LVEF improved to 55% by echocardiogram last year. Continue medical therapy.    CAROTID ARTERY DISEASE   Chronic systolic heart failure   Last Assessment & Plan   12/22/2013 Office Visit Written 12/22/2013  3:38 PM by Lendon Colonel, NP     Review of his recent hospitalization notes state that his ejection fraction has been reduced to 45%. I have discussed with him at length. His medications, reason for taking them, and also marking the bottles. The patient has been placed on a 2 g sodium diet, he is to continue taking Lasix 20 mg by mouth twice a day, he will weigh himself daily on a digital scale.  I am Dr. Myles Gip most recent office note, it is noted that he had an EF of 55% in February 2014, but with reduction of his LV function, I will schedule a Lexiscan  Cardiolite to evaluate for evidence of ischemia causing reduction in heart function.      Respiratory   OSA (obstructive sleep apnea)   Last Assessment & Plan   03/14/2013 Office Visit Written 03/17/2013  5:51  PM by Chesley Mires, MD     Most recent sleep study from July 2014 did not show sleep apnea.    COPD with emphysema   Last Assessment & Plan   12/22/2013 Office Visit Written 12/22/2013  3:40 PM by Lendon Colonel, NP     He has been advised to followup with Dr. Luan Pulling as directed. The patient will transfer his records from pulmonologist in [place] to Dr. Luan Pulling. Prior to that visit.    Chronic respiratory failure with hypoxia   Last Assessment & Plan   03/14/2013 Office Visit Written 03/17/2013  5:51 PM by Chesley Mires, MD     He is to continue 3 liters oxygen.    CAP (community acquired pneumonia)     Endocrine   DM     Other   HYPERLIPIDEMIA   Last Assessment & Plan   05/10/2013 Office Visit Written 05/10/2013  9:13 AM by Satira Sark, MD     Continues on Zocor. Keep follow up lipids with Dr. Berdine Addison.        Imaging: Dg Chest 2 View  12/10/2013   CLINICAL DATA:  Short of breath. Cough. Prior CABG. Hypertension. Diabetes.  EXAM: CHEST  2 VIEW  COMPARISON:  12/24/2010  FINDINGS: Lateral view degraded by patient arm position. Prior median sternotomy. Midline trachea. Mild cardiomegaly with a tortuous thoracic aorta. No pleural effusion or pneumothorax. subtle right upper lobe airspace disease is suspected laterally. Most apparent on the frontal radiograph.  IMPRESSION: Suspicion of right upper lobe airspace disease/pneumonia. This is subtle and warrants followup.  Cardiomegaly without congestive failure.   Electronically Signed   By: Abigail Miyamoto M.D.   On: 12/10/2013 14:09   Ct Angio Chest Pe W/cm &/or Wo Cm  12/10/2013   ADDENDUM REPORT: 12/10/2013 20:14  ADDENDUM: Mild enlargement of the main pulmonary artery, which can be seen in the setting of pulmonary arterial hypertension.   Electronically Signed   By: Logan Bores   On: 12/10/2013 20:14   12/10/2013   CLINICAL DATA:  Shortness of breath and decreased oxygen saturation. Rule out malignancy or pulmonary embolism.  EXAM: CT  ANGIOGRAPHY CHEST WITH CONTRAST  TECHNIQUE: Multidetector CT imaging of the chest was performed using the standard protocol during bolus administration of intravenous contrast. Multiplanar CT image reconstructions and MIPs were obtained to evaluate the vascular anatomy.  CONTRAST:  67mL OMNIPAQUE IOHEXOL 350 MG/ML SOLN  COMPARISON:  Chest radiographs earlier today.  Chest CT 09/16/2011.  FINDINGS: The main pulmonary artery is mildly enlarged, measuring 3.5 cm, similar to the prior CT. No filling defects suggestive of pulmonary emboli are identified. Extensive atherosclerotic calcification is noted involving the thoracic aorta and coronary arteries. Heart is mildly enlarged. Sequelae of prior CABG are identified.  No enlarged axillary, mediastinal, or hilar lymph nodes are identified. No pleural or pericardial effusion is present. Evaluation of the lung parenchyma is mildly limited by respiratory motion artifact. There is centrilobular emphysema and biapical scarring. There is mild dependent subsegmental atelectasis involving the right greater than left lower lobes and posterior right upper lobe. No confluent opacity suggestive of pneumonia is identified. Mild right lower lobe scarring is again seen. Airways are patent.  The visualized portion of the upper abdomen is unremarkable. 2.5 cm lipoma is again seen in the right subscapularis muscle. Mild-to-moderate thoracic spondylosis is noted.  Review of the MIP images confirms the above findings.  IMPRESSION: 1. No evidence of acute pulmonary emboli. 2. Centrilobular emphysema.  No mass.  Electronically Signed: By: Logan Bores On: 12/10/2013 18:47   Nm Myocar Multi W/spect W/wall Motion / Ef  12/30/2013   CLINICAL DATA:  79 year old male with a history of coronary artery disease and coronary artery bypass graft surgery with reduction in ejection fraction to 40-45% which had been previously documented at 55%. He is now referred for an ischemic evaluation.  EXAM:  MYOCARDIAL IMAGING WITH SPECT (REST AND PHARMACOLOGIC-STRESS)  GATED LEFT VENTRICULAR WALL MOTION STUDY  LEFT VENTRICULAR EJECTION FRACTION  TECHNIQUE: Standard myocardial SPECT imaging was performed after resting intravenous injection of 10 mCi Tc-41m sestamibi. Subsequently, intravenous infusion of Lexiscan was performed under the supervision of the Cardiology staff. At peak effect of the drug, 30 mCi Tc-23m sestamibi was injected intravenously and standard myocardial SPECT imaging was performed. Quantitative gated imaging was also performed to evaluate left ventricular wall motion, and estimate left ventricular ejection fraction.  COMPARISON:  None.  FINDINGS: Stress/ECG data: The patient was stressed according to the Lexiscan protocol. The heart rate ranged from 71 up to 88 beats per min. The blood pressure ranges from 149/96 up to 165/60. No chest pain was reported.  Baseline tracing demonstrated sinus rhythm with an underlying right bundle-branch block and  PACs in a pattern of atrial bigeminy. There were T-wave inversions in lead V3. There were no significant changes seen with stress. Isolated PVCs were noted in recovery.  Perfusion: Mildly decreased uptake in the inferoseptal wall on stress and rest images and anteroapical and anteroseptal walls, indicative of myocardial scar. Apical thinning was noted.  Wall Motion: Mild hypokinesis of the aforementioned walls. No left ventricular dilation.  Left Ventricular Ejection Fraction: 47 %  End diastolic volume 161 ml  End systolic volume 56 ml  IMPRESSION: 1. Myocardial scar in inferoseptal, anteroapical and anteroseptal walls. No ischemic zones noted.  2. Mild hypokinesis of the aforementioned walls.  3. Left ventricular ejection fraction 47%  4. Intermediate-risk stress test findings*.  *2012 Appropriate Use Criteria for Coronary Revascularization Focused Update: J Am Coll Cardiol. 0960;45(4):098-119. http://content.airportbarriers.com.aspx?articleid=[email or web address]    Electronically Signed   By: Kate Sable   On: 12/30/2013 16:06

## 2014-01-03 NOTE — Assessment & Plan Note (Signed)
Stress Myoview demonstrated no new areas of ischemia, he had scar tissue noted inferoseptal, anteroapical and anteroseptal. EF was 45%. We will continue him on current medication regimen. I do not feel the need to proceed with a cardiac catheterization in symptoms. No reversible ischemia noted. He will continue on carvedilol, aspirin, Imdur, and Lasix twice a day, with an additional when necessary, fluid retention with ongoing daily weights. He will also need to take potassium with the additional dose of Lasix.

## 2014-01-03 NOTE — Patient Instructions (Addendum)
Your physician wants you to follow-up in: 6 months. You will receive a reminder letter in the mail two months in advance. If you don't receive a letter, please call our office to schedule the follow-up appointment.  Your physician recommends that you continue on your current medications as directed. Please refer to the Current Medication list given to you today.   Thank you for choosing [facility]!    

## 2014-01-03 NOTE — Assessment & Plan Note (Signed)
Blood pressure is well controlled currently. I have counseled him on low sodium diet. He states he sometimes exceeds salty crackers, especially at night before going to bed. I have asked him to get a low sodium or no cracker equivalent. In 6 months unless he becomes symptomatic. We will have him followup with Dr. Domenic Polite, as  He has not seen him over the last few visits.

## 2014-01-03 NOTE — Progress Notes (Signed)
HPI: Mr. Dolce is a 79 y/o patient of Dr. Domenic Polite we follow for ongoing assessment and management of CAD, with history of coronary artery bypass grafting on March of 2009, PCI to the right coronary artery in 2005,status post RFA ablation in 2005. So, the patient last in the office on 12/21/2013 followup after having had significant confusion concerning his medications. He recently been discharged from the hospital in the setting of decompensatedheart failure with pneumonia and diabetes. His EF at that time was found to be reduced to 40-45%.Loni Muse lexican NM stress test was ordered.    IMPRESSION:  1. Myocardial scar in inferoseptal, anteroapical and anteroseptal  walls. No ischemic zones noted.  2. Mild hypokinesis of the aforementioned walls.  3. Left ventricular ejection fraction 47%  4. Intermediate-risk stress test findings*.   And without complaint. He is to weigh himself every day. He has actually lost 5 pounds since the ast time I saw him. Our office note today have him add where he was on the last office visit concerning his weight. He denies any chest pain or shortness of breath. He has had to take an additional Lasix twice since being seen last.  No Known Allergies  Current Outpatient Prescriptions  Medication Sig Dispense Refill  . acetaminophen (TYLENOL) 500 MG tablet Take 500 mg by mouth every 4 (four) hours as needed for moderate pain.       Marland Kitchen albuterol (PROVENTIL HFA;VENTOLIN HFA) 108 (90 BASE) MCG/ACT inhaler Inhale 3 puffs into the lungs every 6 (six) hours as needed for wheezing or shortness of breath.      Marland Kitchen aspirin 81 MG tablet Take 81 mg by mouth daily.      . carvedilol (COREG) 12.5 MG tablet Take 1 tablet (12.5 mg total) by mouth 2 (two) times daily with a meal.  60 tablet  5  . furosemide (LASIX) 20 MG tablet Take 1 tablet (20 mg total) by mouth 2 (two) times daily.  90 tablet  3  . glipiZIDE (GLUCOTROL) 10 MG tablet Take 10 mg by mouth 2 (two) times daily before a  meal.        . isosorbide mononitrate (IMDUR) 30 MG 24 hr tablet Take 30 mg by mouth 2 (two) times daily.      Marland Kitchen lisinopril (PRINIVIL) 2.5 MG tablet Take 1 tablet (2.5 mg total) by mouth daily.  5 tablet  0  . metFORMIN (GLUCOPHAGE) 1000 MG tablet Take 1,000 mg by mouth 2 (two) times daily with a meal.        . Polyethyl Glycol-Propyl Glycol (SYSTANE OP) Place 1 drop into both eyes daily as needed (for burning eyes).      . potassium chloride (K-DUR) 10 MEQ tablet Take 1 tablet (10 mEq total) by mouth daily.  30 tablet  0  . ranitidine (ZANTAC) 150 MG capsule Take 150 mg by mouth at bedtime as needed for heartburn.       . simvastatin (ZOCOR) 20 MG tablet Take 1 tablet (20 mg total) by mouth at bedtime.  30 tablet  12  . tiotropium (SPIRIVA HANDIHALER) 18 MCG inhalation capsule Place 1 capsule (18 mcg total) into inhaler and inhale daily.  30 capsule  12   No current facility-administered medications for this visit.    Past Medical History  Diagnosis Date  . Cardiomyopathy secondary     LVEF 60-65% 9/12, LVEF 43% by Myoview 11/12  . Coronary atherosclerosis of native coronary artery  s/p MI in 2005, s/p DES to RCA in 2005;  s/p CABG in 3/09: L-LAD, S-Dx, S-OM, S-dRCA/PDA  . Type 2 diabetes mellitus   . Atrial flutter     s/p RFCA 2005  . Essential hypertension, benign   . Cerebral aneurysm     s/p clipping   . Mixed hyperlipidemia   . Atrial fibrillation   . Carotid stenosis     h/o occluded RICA  . COPD (chronic obstructive pulmonary disease)   . Lung nodule   . History of tuberculosis     1971  . Diabetes mellitus   . OSA (obstructive sleep apnea) 01/01/2011  . Hypoxemia 03/13/2011    Past Surgical History  Procedure Laterality Date  . Coronary artery bypass graft  2005    LIMA to LAD, SVG to diagonal, SVG to OM, SVG to RCA and PDA    ROS: Review of systems complete and found to be negative unless listed above  PHYSICAL EXAM BP 128/58  Pulse 66  Ht 5\' 11"  (1.803 m)   Wt 179 lb (81.194 kg)  BMI 24.98 kg/m2 General: Well developed, well nourished, in no acute distress Head: Eyes PERRLA, No xanthomas.   Normal cephalic and atramatic  Lungs: Clear bilaterally to auscultation and percussion. Wearing oxygen. Heart: HRRR S1 S2, without MRG.  Pulses are 2+ & equal.            No carotid bruit. No JVD.  No abdominal bruits. No femoral bruits. Abdomen: Bowel sounds are positive, abdomen soft and non-tender without masses or                  Hernia's noted. Msk:  Back normal, normal gait. Normal strength and tone for age. Extremities: No clubbing, cyanosis or edema.  DP +1 Neuro: Alert and oriented X 3. Psych:  Good affect, responds appropriately    ASSESSMENT AND PLAN

## 2014-04-10 DIAGNOSIS — E1165 Type 2 diabetes mellitus with hyperglycemia: Secondary | ICD-10-CM | POA: Diagnosis not present

## 2014-04-10 DIAGNOSIS — J449 Chronic obstructive pulmonary disease, unspecified: Secondary | ICD-10-CM | POA: Diagnosis not present

## 2014-04-13 DIAGNOSIS — J449 Chronic obstructive pulmonary disease, unspecified: Secondary | ICD-10-CM | POA: Diagnosis not present

## 2014-04-13 DIAGNOSIS — R0600 Dyspnea, unspecified: Secondary | ICD-10-CM | POA: Diagnosis not present

## 2014-05-10 DIAGNOSIS — I10 Essential (primary) hypertension: Secondary | ICD-10-CM | POA: Diagnosis not present

## 2014-05-10 DIAGNOSIS — E1165 Type 2 diabetes mellitus with hyperglycemia: Secondary | ICD-10-CM | POA: Diagnosis not present

## 2014-05-10 DIAGNOSIS — I2510 Atherosclerotic heart disease of native coronary artery without angina pectoris: Secondary | ICD-10-CM | POA: Diagnosis not present

## 2014-05-10 DIAGNOSIS — E1122 Type 2 diabetes mellitus with diabetic chronic kidney disease: Secondary | ICD-10-CM | POA: Diagnosis not present

## 2014-05-14 DIAGNOSIS — R0600 Dyspnea, unspecified: Secondary | ICD-10-CM | POA: Diagnosis not present

## 2014-05-14 DIAGNOSIS — J449 Chronic obstructive pulmonary disease, unspecified: Secondary | ICD-10-CM | POA: Diagnosis not present

## 2014-06-05 ENCOUNTER — Ambulatory Visit (INDEPENDENT_AMBULATORY_CARE_PROVIDER_SITE_OTHER): Payer: Medicare Other | Admitting: Cardiology

## 2014-06-05 ENCOUNTER — Encounter: Payer: Self-pay | Admitting: Cardiology

## 2014-06-05 VITALS — BP 132/74 | HR 80 | Ht 71.0 in | Wt 182.0 lb

## 2014-06-05 DIAGNOSIS — I10 Essential (primary) hypertension: Secondary | ICD-10-CM | POA: Diagnosis not present

## 2014-06-05 DIAGNOSIS — I255 Ischemic cardiomyopathy: Secondary | ICD-10-CM

## 2014-06-05 DIAGNOSIS — I5043 Acute on chronic combined systolic (congestive) and diastolic (congestive) heart failure: Secondary | ICD-10-CM

## 2014-06-05 MED ORDER — CARVEDILOL 12.5 MG PO TABS: ORAL_TABLET | ORAL | Status: AC

## 2014-06-05 NOTE — Progress Notes (Signed)
Cardiology Office Note  Date: 06/05/2014   ID: CHIRON CAMPIONE, DOB 1934-09-10, MRN [id number]  PCP: Maggie Font, MD  Primary Cardiologist: Rozann Lesches, MD   Chief Complaint  Patient presents with  . Coronary Artery Disease  . Hypertension  . Cardiomyopathy    History of Present Illness: Jared Santos is a 80 y.o. male last seen in September 2015 by Ms. Lawrence NP. He presents for a routine visit. He does not endorse any angina symptoms, but has been short of breath and NYHA class III, wears oxygen daily. He has also had increasing leg edema and a general sense of rapid heart rate and palpitations. He reports compliance with his medications. His weight is up approximately 5 pounds.  We reviewed his current cardiac regimen. He has a scale at home, but is not able to read it easily, therefore does not track his weights with any regularity.  Most recent lab work is outlined below.   Past Medical History  Diagnosis Date  . Cardiomyopathy secondary     LVEF 60-65% 9/12, LVEF 43% by Myoview 11/12  . Coronary atherosclerosis of native coronary artery     s/p MI in 2005, s/p DES to RCA in 2005;  s/p CABG in 3/09: L-LAD, S-Dx, S-OM, S-dRCA/PDA  . Type 2 diabetes mellitus   . Atrial flutter     s/p RFCA 2005  . Essential hypertension, benign   . Cerebral aneurysm     s/p clipping   . Mixed hyperlipidemia   . Carotid stenosis     h/o occluded RICA  . COPD (chronic obstructive pulmonary disease)   . Lung nodule   . History of tuberculosis     1971  . Diabetes mellitus   . OSA (obstructive sleep apnea) 01/01/2011  . Hypoxemia 03/13/2011    Past Surgical History  Procedure Laterality Date  . Coronary artery bypass graft  2005    LIMA to LAD, SVG to diagonal, SVG to OM, SVG to RCA and PDA    Current Outpatient Prescriptions  Medication Sig Dispense Refill  . acetaminophen (TYLENOL) 500 MG tablet Take 500 mg by mouth every 4 (four) hours as needed for moderate pain.      Marland Kitchen albuterol (PROVENTIL HFA;VENTOLIN HFA) 108 (90 BASE) MCG/ACT inhaler Inhale 3 puffs into the lungs every 6 (six) hours as needed for wheezing or shortness of breath.    Marland Kitchen aspirin 81 MG tablet Take 81 mg by mouth daily.    . carvedilol (COREG) 12.5 MG tablet Take 1 1/2 tablets twice a day 90 tablet 5  . furosemide (LASIX) 20 MG tablet Take 1 tablet (20 mg total) by mouth 2 (two) times daily. 90 tablet 3  . glipiZIDE (GLUCOTROL) 10 MG tablet Take 10 mg by mouth 2 (two) times daily before a meal.      . isosorbide mononitrate (IMDUR) 30 MG 24 hr tablet Take 30 mg by mouth 2 (two) times daily.    Marland Kitchen lisinopril (PRINIVIL) 2.5 MG tablet Take 1 tablet (2.5 mg total) by mouth daily. 5 tablet 0  . metFORMIN (GLUCOPHAGE) 1000 MG tablet Take 1,000 mg by mouth 2 (two) times daily with a meal.      . Polyethyl Glycol-Propyl Glycol (SYSTANE OP) Place 1 drop into both eyes daily as needed (for burning eyes).    . potassium chloride (K-DUR) 10 MEQ tablet Take 1 tablet (10 mEq total) by mouth daily. 30 tablet 0  . ranitidine (ZANTAC) 150 MG capsule  Take 150 mg by mouth at bedtime as needed for heartburn.     . simvastatin (ZOCOR) 20 MG tablet Take 1 tablet (20 mg total) by mouth at bedtime. 30 tablet 12  . tiotropium (SPIRIVA HANDIHALER) 18 MCG inhalation capsule Place 1 capsule (18 mcg total) into inhaler and inhale daily. 30 capsule 12   No current facility-administered medications for this visit.    Allergies:  Review of patient's allergies indicates no known allergies.   Social History: The patient  reports that he quit smoking about 28 years ago. His smoking use included Cigarettes. He started smoking about 61 years ago. He has a 35 pack-year smoking history. He has never used smokeless tobacco. He reports that he does not drink alcohol or use illicit drugs.   Family History: The patient's family history includes Arthritis in his mother; Diabetes in his mother; Hypertension in an other family member;  Tuberculosis in his brother and sister.   ROS:  Please see the history of present illness. Otherwise, complete review of systems is positive for being hard of hearing.  All other systems are reviewed and negative.    Physical Exam: VS:  BP 132/74 mmHg  Pulse 80  Ht 5\' 11"  (1.803 m)  Wt 182 lb (82.555 kg)  BMI 25.40 kg/m2  SpO2 95%, BMI Body mass index is 25.4 kg/(m^2).  Wt Readings from Last 3 Encounters:  06/05/14 182 lb (82.555 kg)  01/03/14 179 lb (81.194 kg)  12/22/13 178 lb (80.74 kg)     NAD, wearing oxygen.  HEENT: Conjunctiva and lids normal, oropharynx with moist mucosa.  Neck: Supple, no elevated JVP, no thyromegaly.  Lungs: Diminished but clear breath sounds, nonlabored, no wheezing.  Cardiac: Regular rate and rhythm with occasional ectopic beat, soft systolic murmur, no S3.  Abdomen: Soft, nontender, bowel sounds present.  Skin: Warm and dry.  Musculoskeletal: No kyphosis.  Extremities: 1-2+ leg edema, distal pulses one plus.  Neuropsychiatric: Alert and oriented x3, moves all extremities equally.   ECG: ECG is not ordered today.  Recent Labwork: 12/10/2013: Magnesium 1.5; TSH 0.930 12/11/2013: Pro B Natriuretic peptide (BNP) 7375.0* 12/13/2013: Hemoglobin 15.3; Platelets 102* 12/14/2013: ALT 44; AST 37; BUN 40*; Creatinine 1.14; Potassium 4.4; Sodium 135*     Component Value Date/Time   CHOL 136 12/25/2010 0720   TRIG 117 12/25/2010 0720   HDL 42 12/25/2010 0720   CHOLHDL 3.2 12/25/2010 0720   VLDL 23 12/25/2010 0720   LDLCALC 71 12/25/2010 0720    Other Studies Reviewed Today:  1. Lexiscan Cardiolite 12/30/2013: EXAM: MYOCARDIAL IMAGING WITH SPECT (REST AND PHARMACOLOGIC-STRESS)  GATED LEFT VENTRICULAR WALL MOTION STUDY  LEFT VENTRICULAR EJECTION FRACTION  TECHNIQUE: Standard myocardial SPECT imaging was performed after resting intravenous injection of 10 mCi Tc-10m sestamibi. Subsequently, intravenous infusion of Lexiscan was performed  under the supervision of the Cardiology staff. At peak effect of the drug, 30 mCi Tc-46m sestamibi was injected intravenously and standard myocardial SPECT imaging was performed. Quantitative gated imaging was also performed to evaluate left ventricular wall motion, and estimate left ventricular ejection fraction.  COMPARISON: None.  FINDINGS: Stress/ECG data: The patient was stressed according to the Lexiscan protocol. The heart rate ranged from 71 up to 88 beats per min. The blood pressure ranges from 149/96 up to 165/60. No chest pain was reported.  Baseline tracing demonstrated sinus rhythm with an underlying right bundle-branch block and PACs in a pattern of atrial bigeminy. There were T-wave inversions in lead V3. There were no  significant changes seen with stress. Isolated PVCs were noted in recovery.  Perfusion: Mildly decreased uptake in the inferoseptal wall on stress and rest images and anteroapical and anteroseptal walls, indicative of myocardial scar. Apical thinning was noted.  Wall Motion: Mild hypokinesis of the aforementioned walls. No left ventricular dilation.  Left Ventricular Ejection Fraction: 47 %  End diastolic volume 734 ml  End systolic volume 56 ml  IMPRESSION: 1. Myocardial scar in inferoseptal, anteroapical and anteroseptal walls. No ischemic zones noted.  2. Mild hypokinesis of the aforementioned walls.  3. Left ventricular ejection fraction 47%  4. Intermediate-risk stress test findings*.  2. Echocardiogram 12/11/2013: Study Conclusions  - Left ventricle: The cavity size was normal. Wall thickness was increased in a pattern of moderate LVH. Systolic function was mildly to moderately reduced. The estimated ejection fraction was in the range of 40% to 45%. There is hypokinesis of the inferior myocardium. Doppler parameters are consistent with abnormal left ventricular relaxation (grade 1 diastolic dysfunction).  Doppler parameters are consistent with high ventricular filling pressure. - Aortic valve: There was mild regurgitation. - Left atrium: The atrium was mildly dilated. - Right ventricle: The cavity size was mildly dilated. Systolic function was severely reduced. - Right atrium: The atrium was mildly dilated. - Atrial septum: The septum bowed from right to left, consistent with increased right atrial pressure. - Tricuspid valve: There was moderate regurgitation. - Pulmonary arteries: Systolic pressure was moderately to severely increased. PA peak pressure: 53 mm Hg (S).  Impressions:  - Inferior hypokinesis with mild to moderate reduction in LV function; grade 1 diastolic dysfunction; mild RAE and RVE; moderate TR; severely reduced RV function; moderate to severe elevation in pulmonary pressures; mild AI.   ASSESSMENT AND PLAN:  1. Acute on chronic combined heart failure with mild volume overload. Plan is to increase Lasix to 80 mg daily for the next few days, then adjust back to normal dose. We did discuss obtaining more regular weights at home to help him self adjust Lasix for increases of 2-3 pounds in 24 hours. Follow-up plan the next 6 weeks with BMET.  2. Ischemic cardiomyopathy with LVEF approximately 40-45%, no large ischemic burden by recent Cardiolite outlined above. Plan to try and increase Coreg to 12.5 mg one half tablets twice daily. Continue remaining regimen as outlined.  3. COPD, on home oxygen, followed by Dr. Halford Chessman.  4. Essential hypertension. Changes being made as outlined above.  Current medicines are reviewed at length with the patient today.  The patient does not have concerns regarding medicines.   Orders Placed This Encounter  Procedures  . Basic Metabolic Panel (BMET)    Disposition: FU with me in 6 weeks.   Signed, Satira Sark, MD, [facility] 06/05/2014 1:44 PM    [place] at [facility] [address],  [place], [place] [address] Phone: [phone]; Fax: [phone]

## 2014-06-05 NOTE — Patient Instructions (Addendum)
Your physician recommends that you schedule a follow-up appointment in: 6 weeks     Take lasix 40 mg twice a day for the next 2 days, then go back to lasix 20 mg twice a day   INCREASE Coreg to 12.5 mg - take (1 1/2 tablets twice a day)   Please get lab work Artist) before next visit- 3 days before      Thank you for choosing [facility] !

## 2014-06-07 ENCOUNTER — Other Ambulatory Visit: Payer: Self-pay | Admitting: Cardiology

## 2014-06-07 MED ORDER — SIMVASTATIN 20 MG PO TABS: 20.0000 mg | ORAL_TABLET | Freq: Every day | ORAL | Status: AC

## 2014-06-07 NOTE — Telephone Encounter (Signed)
Received fax refill request  Rx # [id number] Medication:  Simvastatin 20 mg tablet Qty 30 Sig:  Take one tablet by mouth at bedtime Physician:  Domenic Polite

## 2014-06-12 DIAGNOSIS — J449 Chronic obstructive pulmonary disease, unspecified: Secondary | ICD-10-CM | POA: Diagnosis not present

## 2014-06-12 DIAGNOSIS — R0600 Dyspnea, unspecified: Secondary | ICD-10-CM | POA: Diagnosis not present

## 2014-07-06 ENCOUNTER — Other Ambulatory Visit: Payer: Self-pay | Admitting: Pulmonary Disease

## 2014-07-06 MED ORDER — TIOTROPIUM BROMIDE MONOHYDRATE 18 MCG IN CAPS: 18.0000 ug | ORAL_CAPSULE | Freq: Every day | RESPIRATORY_TRACT | Status: DC

## 2014-07-12 DIAGNOSIS — I255 Ischemic cardiomyopathy: Secondary | ICD-10-CM | POA: Diagnosis not present

## 2014-07-12 DIAGNOSIS — I2589 Other forms of chronic ischemic heart disease: Secondary | ICD-10-CM | POA: Diagnosis not present

## 2014-07-13 ENCOUNTER — Ambulatory Visit (INDEPENDENT_AMBULATORY_CARE_PROVIDER_SITE_OTHER): Payer: Medicare Other | Admitting: Cardiology

## 2014-07-13 ENCOUNTER — Encounter: Payer: Self-pay | Admitting: Cardiology

## 2014-07-13 VITALS — BP 134/62 | HR 82 | Ht 71.0 in | Wt 185.0 lb

## 2014-07-13 DIAGNOSIS — I2510 Atherosclerotic heart disease of native coronary artery without angina pectoris: Secondary | ICD-10-CM

## 2014-07-13 DIAGNOSIS — J439 Emphysema, unspecified: Secondary | ICD-10-CM | POA: Diagnosis not present

## 2014-07-13 DIAGNOSIS — I10 Essential (primary) hypertension: Secondary | ICD-10-CM | POA: Diagnosis not present

## 2014-07-13 DIAGNOSIS — J449 Chronic obstructive pulmonary disease, unspecified: Secondary | ICD-10-CM | POA: Diagnosis not present

## 2014-07-13 DIAGNOSIS — I5041 Acute combined systolic (congestive) and diastolic (congestive) heart failure: Secondary | ICD-10-CM

## 2014-07-13 DIAGNOSIS — R0600 Dyspnea, unspecified: Secondary | ICD-10-CM | POA: Diagnosis not present

## 2014-07-13 LAB — BASIC METABOLIC PANEL
BUN: 20 mg/dL (ref 6–23)
CHLORIDE: 101 meq/L (ref 96–112)
CO2: 26 mEq/L (ref 19–32)
Calcium: 8.8 mg/dL (ref 8.4–10.5)
Creat: 1.31 mg/dL (ref 0.50–1.35)
Glucose, Bld: 283 mg/dL — ABNORMAL HIGH (ref 70–99)
Potassium: 3.8 mEq/L (ref 3.5–5.3)
SODIUM: 139 meq/L (ref 135–145)

## 2014-07-13 MED ORDER — FUROSEMIDE 40 MG PO TABS: 40.0000 mg | ORAL_TABLET | Freq: Two times a day (BID) | ORAL | Status: AC

## 2014-07-13 NOTE — Progress Notes (Signed)
Cardiology Office Note  Date: 07/13/2014   ID: Jared Santos, DOB Mar 09, 1935, MRN [id number]  PCP: Maggie Font, MD  Primary Cardiologist: Rozann Lesches, MD   Chief Complaint  Patient presents with  . Coronary Artery Disease  . Cardiomyopathy    History of Present Illness: Jared Santos is a 80 y.o. Jared last seen in February. At that visit we temporarily increased Lasix to further manage mild volume overload, also recommended more regular weight checks at home and self adjustment of Lasix. Jared Santos has actually continued Lasix at the higher dose, 40 mg twice daily, does admit to some improvement in his leg edema. Jared Santos remains chronically short of breath, on continuous oxygen with history of chronic lung disease. Weight is relatively stable.  I reviewed his follow-up lab work, creatinine 1.3 with normal potassium.  Jared Santos states that Jared Santos will be seeing Dr. Berdine Addison in the next week. Jared Santos has been having some reflux symptoms despite intermittent use of Zantac. Glucose is also been persistently elevated.   Past Medical History  Diagnosis Date  . Cardiomyopathy secondary     LVEF 60-65% 9/12, LVEF 43% by Myoview 11/12  . Coronary atherosclerosis of native coronary artery     s/p MI in 2005, s/p DES to RCA in 2005;  s/p CABG in 3/09: L-LAD, S-Dx, S-OM, S-dRCA/PDA  . Type 2 diabetes mellitus   . Atrial flutter     s/p RFCA 2005  . Essential hypertension, benign   . Cerebral aneurysm     s/p clipping   . Mixed hyperlipidemia   . Carotid stenosis     Occluded RICA  . COPD (chronic obstructive pulmonary disease)   . Lung nodule   . History of tuberculosis     1971  . OSA (obstructive sleep apnea)     Past Surgical History  Procedure Laterality Date  . Coronary artery bypass graft  2005    LIMA to LAD, SVG to diagonal, SVG to OM, SVG to RCA and PDA    Current Outpatient Prescriptions  Medication Sig Dispense Refill  . acetaminophen (TYLENOL) 500 MG tablet Take 500 mg by mouth every 4  (four) hours as needed for moderate pain.     Marland Kitchen albuterol (PROVENTIL HFA;VENTOLIN HFA) 108 (90 BASE) MCG/ACT inhaler Inhale 3 puffs into the lungs every 6 (six) hours as needed for wheezing or shortness of breath.    Marland Kitchen aspirin 81 MG tablet Take 81 mg by mouth daily.    . carvedilol (COREG) 12.5 MG tablet Take 1 1/2 tablets twice a day 90 tablet 5  . furosemide (LASIX) 40 MG tablet Take 1 tablet (40 mg total) by mouth 2 (two) times daily. 180 tablet 3  . glipiZIDE (GLUCOTROL) 10 MG tablet Take 10 mg by mouth 2 (two) times daily before a meal.      . isosorbide mononitrate (IMDUR) 30 MG 24 hr tablet Take 30 mg by mouth 2 (two) times daily.    Marland Kitchen lisinopril (PRINIVIL) 2.5 MG tablet Take 1 tablet (2.5 mg total) by mouth daily. 5 tablet 0  . metFORMIN (GLUCOPHAGE) 1000 MG tablet Take 1,000 mg by mouth 2 (two) times daily with a meal.      . Polyethyl Glycol-Propyl Glycol (SYSTANE OP) Place 1 drop into both eyes daily as needed (for burning eyes).    . potassium chloride (K-DUR) 10 MEQ tablet Take 1 tablet (10 mEq total) by mouth daily. 30 tablet 0  . ranitidine (ZANTAC) 150 MG capsule Take  150 mg by mouth at bedtime as needed for heartburn.     . simvastatin (ZOCOR) 20 MG tablet Take 1 tablet (20 mg total) by mouth at bedtime. 30 tablet 12  . tiotropium (SPIRIVA HANDIHALER) 18 MCG inhalation capsule Place 1 capsule (18 mcg total) into inhaler and inhale daily. 30 capsule 0   No current facility-administered medications for this visit.    Allergies:  Review of patient's allergies indicates no known allergies.   Social History: The patient  reports that Jared Santos quit smoking about 28 years ago. His smoking use included Cigarettes. Jared Santos started smoking about 61 years ago. Jared Santos has a 35 pack-year smoking history. Jared Santos has never used smokeless tobacco. Jared Santos reports that Jared Santos does not drink alcohol or use illicit drugs.   ROS:  Please see the history of present illness. Otherwise, complete review of systems is positive  for decreased hearing, chronic leg edema, NYHA class 2-3 dyspnea on exertion.  All other systems are reviewed and negative.   Physical Exam: VS:  BP 134/62 mmHg  Pulse 82  Ht 5\' 11"  (1.803 m)  Wt 185 lb (83.915 kg)  BMI 25.81 kg/m2  SpO2 , BMI Body mass index is 25.81 kg/(m^2).  Wt Readings from Last 3 Encounters:  07/13/14 185 lb (83.915 kg)  06/05/14 182 lb (82.555 kg)  01/03/14 179 lb (81.194 kg)     NAD, wearing oxygen.  HEENT: Conjunctiva and lids normal, oropharynx with moist mucosa.  Neck: Supple, no elevated JVP, no thyromegaly.  Lungs: Diminished but clear breath sounds, nonlabored, no wheezing.  Cardiac: Regular rate and rhythm with occasional ectopic beat, soft systolic murmur, no S3.  Abdomen: Soft, nontender, bowel sounds present.  Skin: Warm and dry.  Musculoskeletal: No kyphosis.  Extremities: 1-2+ leg edema, distal pulses one plus.  Neuropsychiatric: Alert and oriented x3, moves all extremities equally.   ECG: ECG is not ordered today.  Recent Labwork: 12/10/2013: Magnesium 1.5; TSH 0.930 12/11/2013: Pro B Natriuretic peptide (BNP) 7375.0* 12/13/2013: Hemoglobin 15.3; Platelets 102* 12/14/2013: ALT 44; AST 37 07/12/2014: BUN 20; Creatinine 1.31; Potassium 3.8; Sodium 139   Other Studies Reviewed Today:  Echocardiogram 12/11/2013: Study Conclusions  - Left ventricle: The cavity size was normal. Wall thickness was increased in a pattern of moderate LVH. Systolic function was mildly to moderately reduced. The estimated ejection fraction was in the range of 40% to 45%. There is hypokinesis of the inferior myocardium. Doppler parameters are consistent with abnormal left ventricular relaxation (grade 1 diastolic dysfunction). Doppler parameters are consistent with high ventricular filling pressure. - Aortic valve: There was mild regurgitation. - Left atrium: The atrium was mildly dilated. - Right ventricle: The cavity size was mildly dilated.  Systolic function was severely reduced. - Right atrium: The atrium was mildly dilated. - Atrial septum: The septum bowed from right to left, consistent with increased right atrial pressure. - Tricuspid valve: There was moderate regurgitation. - Pulmonary arteries: Systolic pressure was moderately to severely increased. PA peak pressure: 53 mm Hg (S).  Impressions:  - Inferior hypokinesis with mild to moderate reduction in LV function; grade 1 diastolic dysfunction; mild RAE and RVE; moderate TR; severely reduced RV function; moderate to severe elevation in pulmonary pressures; mild AI.   ASSESSMENT AND PLAN:  1. Combined systolic and diastolic heart failure. Continue current regimen, refill provided for Lasix at 40 mg twice daily. Recommended that Jared Santos continue to weigh himself daily.  2. CAD status post percutaneous interventions and CABG as outlined above. No active  angina symptoms.  3. Essential hypertension, blood pressure reasonably well controlled today.  4. COPD on chronic oxygen.  Current medicines were reviewed at length with the patient today.   Disposition: FU with me in 3 months.   Signed, Satira Sark, MD, [facility] 07/13/2014 10:27 AM    [facility] at [place] [place], [place] [address] Phone: [phone]; Fax: [phone]

## 2014-07-13 NOTE — Patient Instructions (Signed)
Your physician recommends that you schedule a follow-up appointment in: 3 months with Dr.McDowell    INCREASE Lasix to 40 mg twice a day     Thank you for choosing [facility] !

## 2014-07-15 DIAGNOSIS — E1165 Type 2 diabetes mellitus with hyperglycemia: Secondary | ICD-10-CM | POA: Diagnosis not present

## 2014-07-15 DIAGNOSIS — I110 Hypertensive heart disease with heart failure: Secondary | ICD-10-CM | POA: Diagnosis not present

## 2014-07-17 ENCOUNTER — Telehealth: Payer: Self-pay | Admitting: Pulmonary Disease

## 2014-07-17 NOTE — Telephone Encounter (Signed)
Can schedule with Tammy Parrett.

## 2014-07-17 NOTE — Telephone Encounter (Signed)
Patient needs an appointment with Dr. Halford Chessman ASAP for hospital follow up, Dr. Juanetta Gosling next available appointment is not until May 27th.  Dr. Halford Chessman, ok to schedule for May 27th or do you want me to schedule him with another provider?  Please advise.

## 2014-07-17 NOTE — Telephone Encounter (Signed)
Pt scheduled next available with TP.  Nothing further needed.

## 2014-07-17 NOTE — Telephone Encounter (Signed)
Error wrong dr.Stanley A Dalton ° ° °

## 2014-07-27 ENCOUNTER — Ambulatory Visit (INDEPENDENT_AMBULATORY_CARE_PROVIDER_SITE_OTHER)
Admission: RE | Admit: 2014-07-27 | Discharge: 2014-07-27 | Disposition: A | Discharge status: Home / Self Care | Payer: Medicare Other | Source: Ambulatory Visit | Attending: Adult Health | Admitting: Adult Health

## 2014-07-27 ENCOUNTER — Ambulatory Visit (INDEPENDENT_AMBULATORY_CARE_PROVIDER_SITE_OTHER): Payer: Medicare Other | Admitting: Adult Health

## 2014-07-27 ENCOUNTER — Encounter: Payer: Self-pay | Admitting: Adult Health

## 2014-07-27 VITALS — BP 150/68 | HR 70 | Temp 97.3°F | Ht 71.0 in | Wt 181.0 lb

## 2014-07-27 DIAGNOSIS — J438 Other emphysema: Secondary | ICD-10-CM

## 2014-07-27 DIAGNOSIS — J189 Pneumonia, unspecified organism: Secondary | ICD-10-CM

## 2014-07-27 DIAGNOSIS — I5022 Chronic systolic (congestive) heart failure: Secondary | ICD-10-CM

## 2014-07-27 DIAGNOSIS — J9611 Chronic respiratory failure with hypoxia: Secondary | ICD-10-CM | POA: Diagnosis not present

## 2014-07-27 DIAGNOSIS — J9610 Chronic respiratory failure, unspecified whether with hypoxia or hypercapnia: Secondary | ICD-10-CM | POA: Insufficient documentation

## 2014-07-27 MED ORDER — PREDNISONE 10 MG PO TABS: ORAL_TABLET | ORAL | Status: DC

## 2014-07-27 MED ORDER — DOXYCYCLINE HYCLATE 100 MG PO TABS: 100.0000 mg | ORAL_TABLET | Freq: Two times a day (BID) | ORAL | Status: DC

## 2014-07-27 NOTE — Assessment & Plan Note (Signed)
Compensated on 3 L of oxygen without desaturations

## 2014-07-27 NOTE — Assessment & Plan Note (Addendum)
Appears compensated on current regimen without  Evidence of fluid overload on exam  Weight appears somewhat stable.  The on low salt diet

## 2014-07-27 NOTE — Assessment & Plan Note (Signed)
Exacerbation  Check cxr today  If cont to be symptomatic consider spirometry on return /add ICS/LABA .    Plan  Doxycycline 100 mg twice daily for 7 days  Mucinex DM twice daily as needed for cough and congestion  Prednisone taper over the next week.  Chest x-ray today  Follow-up with Dr. Halford Chessman  in 4-6 weeks and as needed Please contact office for sooner follow up if symptoms do not improve or worsen or seek emergency care

## 2014-07-27 NOTE — Patient Instructions (Signed)
Doxycycline 100 mg twice daily for 7 days  Mucinex DM twice daily as needed for cough and congestion  Prednisone taper over the next week.  Chest x-ray today  Follow-up with Dr. Halford Chessman  in 4-6 weeks and as needed Please contact office for sooner follow up if symptoms do not improve or worsen or seek emergency care

## 2014-07-27 NOTE — Progress Notes (Signed)
Subjective:    Patient ID: Jared Santos, male    DOB: Aug 07, 1934, 80 y.o.   MRN: [id number]  HPI 80 y.o. male former smoker with  dyspnea,COPD/emphysema.  Chronic Resp Failure on O2   07/27/2014 Acute OV  Patient presents for an acute office visit Patient complains over the last several weeks he's been having increased cough, congestion with thick brown mucus and increased shortness of breath. Patient is on 3 L of oxygen . It is noticed that his oxygen levels been dropping down below 90%.. He does use a pulse\demand setting oxygen with walking. Today in the office when walked he did not desat on 3l/m O2 on demand setting.  He denies any hemoptysis, orthopnea, PND or leg swelling. He remains on Spiriva daily. He was last seen in the office in 2014. He was hospitalized last September 2015 with decompensated congestive heart failure. And a COPD exacerbation. He was treated with diuretics. Chest x-ray did show a possible right-sided pneumonia. However, follow-up CT chest showed no sign of pulmonary embolism, or pneumonia.. Pt is on Lisinopril and coreg.    TESTS:  CT chest 12/24/10>>No PE. RLL scar vs ATX, emphysema, 1.4 cm nodular scar ant RUL increase in size since 2008  PSG 02/23/11>>AHI 0.3, RDI 18.6, SpO2 low 82%. Spent 168 min with SpO2 < 88%. Used 2 liters oxygen during test.  PET scan 03/07/11 >> no activity RUL density  03/13/11 Ambulatory SpO2 on room air >> 87%.  PFT 03/14/11>>FEV1 2.42(84%), FEV1% 58, TLC 7.22(108%), DLCO 44%, no BD.  CT chest 07/21/11 >> RUL nodule resolved, centrilobular emphysema  ONO with 3 liters 04/22/12 >> Test time 11 hrs 33 min. Mean SpO2 92%, low SpO2 76%. Spent 46 min with SpO2 < 88%.  ONO with 4 liters 05/04/12 >> Test time 11 hrs. Mean SpO2 91%, low SpO2 74%. Spent 1 hr 55 min with SpO2 < 88%.  Echo 05/12/12 >> EF 55%, mod LA dilation, mild/mod RA dilation, PAS 62 mmHg  PSG 10/19/12 >> AHI 0.2, used 3 liters oxygen      Review of  Systems Constitutional:   No  weight loss, night sweats,  Fevers, chills, + fatigue, or  lassitude.  HEENT:   No headaches,  Difficulty swallowing,  Tooth/dental problems, or  Sore throat,                No sneezing, itching, ear ache, nasal congestion, post nasal drip,   CV:  No chest pain,  Orthopnea, PND, swelling in lower extremities, anasarca, dizziness, palpitations, syncope.   GI  No heartburn, indigestion, abdominal pain, nausea, vomiting, diarrhea, change in bowel habits, loss of appetite, bloody stools.   Resp:    No chest wall deformity  Skin: no rash or lesions.  GU: no dysuria, change in color of urine, no urgency or frequency.  No flank pain, no hematuria   MS:  No joint pain or swelling.  No decreased range of motion.  No back pain.  Psych:  No change in mood or affect. No depression or anxiety.  No memory loss.         Objective:   Physical Exam GEN: A/Ox3; pleasant , NAD, chronically ill appearing on O2.   HEENT:  [place]/AT,  EACs-clear, TMs-wnl, NOSE-clear, THROAT-clear, no lesions, no postnasal drip or exudate noted.   NECK:  Supple w/ fair ROM; no JVD; normal carotid impulses w/o bruits; no thyromegaly or nodules palpated; no lymphadenopathy.  RESP  Few trace rhonchi , no  accessory muscle use, no dullness to percussion  CARD:  RRR, no m/r/g  ,tr -1 + peripheral edema, pulses intact, no cyanosis or clubbing.  GI:   Soft & nt; nml bowel sounds; no organomegaly or masses detected.  Musco: Warm bil, no deformities or joint swelling noted.   Neuro: alert, no focal deficits noted.    Skin: Warm, no lesions or rashes        Assessment & Plan:

## 2014-07-30 NOTE — Progress Notes (Signed)
Reviewed and agree with assessment/plan. 

## 2014-08-04 NOTE — Progress Notes (Signed)
Quick Note:  LMOM TCB x1. ______ 

## 2014-08-12 DIAGNOSIS — R0600 Dyspnea, unspecified: Secondary | ICD-10-CM | POA: Diagnosis not present

## 2014-08-12 DIAGNOSIS — J449 Chronic obstructive pulmonary disease, unspecified: Secondary | ICD-10-CM | POA: Diagnosis not present

## 2014-08-16 DIAGNOSIS — E1165 Type 2 diabetes mellitus with hyperglycemia: Secondary | ICD-10-CM | POA: Diagnosis not present

## 2014-08-16 DIAGNOSIS — I255 Ischemic cardiomyopathy: Secondary | ICD-10-CM | POA: Diagnosis not present

## 2014-08-16 DIAGNOSIS — Z0000 Encounter for general adult medical examination without abnormal findings: Secondary | ICD-10-CM | POA: Diagnosis not present

## 2014-08-16 DIAGNOSIS — J449 Chronic obstructive pulmonary disease, unspecified: Secondary | ICD-10-CM | POA: Diagnosis not present

## 2014-09-12 DIAGNOSIS — R0600 Dyspnea, unspecified: Secondary | ICD-10-CM | POA: Diagnosis not present

## 2014-09-12 DIAGNOSIS — J449 Chronic obstructive pulmonary disease, unspecified: Secondary | ICD-10-CM | POA: Diagnosis not present

## 2014-09-14 ENCOUNTER — Ambulatory Visit (INDEPENDENT_AMBULATORY_CARE_PROVIDER_SITE_OTHER): Payer: Medicare Other | Admitting: Pulmonary Disease

## 2014-09-14 ENCOUNTER — Encounter: Payer: Self-pay | Admitting: Pulmonary Disease

## 2014-09-14 VITALS — BP 148/70 | HR 68 | Ht 71.0 in | Wt 186.0 lb

## 2014-09-14 DIAGNOSIS — J9611 Chronic respiratory failure with hypoxia: Secondary | ICD-10-CM

## 2014-09-14 DIAGNOSIS — J432 Centrilobular emphysema: Secondary | ICD-10-CM | POA: Diagnosis not present

## 2014-09-14 MED ORDER — TIOTROPIUM BROMIDE MONOHYDRATE 18 MCG IN CAPS: 18.0000 ug | ORAL_CAPSULE | Freq: Every day | RESPIRATORY_TRACT | Status: AC

## 2014-09-14 NOTE — Progress Notes (Signed)
Chief Complaint  Patient presents with  . Follow-up    Pt reports no improvement since seeing Tp 07/2014. Pt states that his breathing seems worse. Pt needs refills of Spiriva.  O2 low on 3L/min at 83%, recovered to 90% after increasing O2 to 4L/min    History of Present Illness: Jared Santos is a 80 y.o. male former smoker with dyspnea, COPD/emphysema.  He has been running low on spiriva and was rationing his inhalers.  He has been feeling more short of breath with activity.  He has cough with brown sputum.  He denies wheeze, chest pain, hemoptysis.  He was getting more leg swelling >> improved after his lasix was adjusted.  He has been using 3 liters oxygen.  His oxygen level was low when he came in to office today >> improved with 4 liters oxygen.  TESTS: CT chest 12/24/10>>No PE.  RLL scar vs ATX, emphysema, 1.4 cm nodular scar ant RUL increase in size since 2008 PSG 02/23/11>>AHI 0.3, RDI 18.6, SpO2 low 82%.  Spent 168 min with SpO2 < 88%.  Used 2 liters oxygen during test. PET scan 03/07/11 >> no activity RUL density 03/13/11 Ambulatory SpO2 on room air >> 87%. PFT 03/14/11>>FEV1 2.42(84%), FEV1% 58, TLC 7.22(108%), DLCO 44%, no BD. CT chest 07/21/11 >> RUL nodule resolved, centrilobular emphysema ONO with 3 liters 04/22/12 >> Test time 11 hrs 33 min.  Mean SpO2 92%, low SpO2 76%.  Spent 46 min with SpO2 < 88%. ONO with 4 liters 05/04/12 >> Test time 11 hrs.  Mean SpO2 91%, low SpO2 74%.  Spent 1 hr 55 min with SpO2 < 88%. PSG 10/19/12 >> AHI 0.2, used 3 liters oxygen CT chest 12/10/13 >> centrilobular emphysema Echo 12/11/13 >> mod LVH, EF 40 to 17%, grade 1 diastolic dysfx  PMHx >> CAD, A flutter, systolic CHF, DM, HTN, HLD  PSHx, Medications, Allergies, Fhx, Shx reviewed.  Physical Exam: Blood pressure 148/70, pulse 68, height 5\' 11"  (1.803 m), weight 186 lb (84.369 kg), SpO2 90 %. Body mass index is 25.95 kg/(m^2).  General - No distress, wearing oxygen ENT - No sinus  tenderness, no oral exudate, no LAN Cardiac - s1s2 regular, no murmur Chest - No wheeze/rales/dullness Back - No focal tenderness Abd - Soft, non-tender Ext - 1+ edema Neuro - Normal strength Skin - No rashes Psych - normal mood, and behavior   Assessment/Plan:  COPD with emphysema. Plan: - will refill his spiriva - continue prn albuterol - if his breathing does not improve, then consider adding LABA +/- ICS - I completed his handicap parking form  Chronic hypoxic respiratory failure. Plan: - will increase his oxygen to 4 liters with exertion and sleep - continue 3 liters oxygen at rest  Chronic systolic CHF. Elevated blood pressure. Plan: - he is to f/u with cardiology   Chesley Mires, MD [facility] Pager:  [phone] 09/14/2014, 2:12 PM

## 2014-09-14 NOTE — Patient Instructions (Signed)
Follow up in 6 months 

## 2014-09-20 DIAGNOSIS — I5022 Chronic systolic (congestive) heart failure: Secondary | ICD-10-CM | POA: Diagnosis not present

## 2014-09-20 DIAGNOSIS — Z9981 Dependence on supplemental oxygen: Secondary | ICD-10-CM | POA: Diagnosis not present

## 2014-09-20 DIAGNOSIS — E1165 Type 2 diabetes mellitus with hyperglycemia: Secondary | ICD-10-CM | POA: Diagnosis not present

## 2014-09-20 DIAGNOSIS — Z6827 Body mass index (BMI) 27.0-27.9, adult: Secondary | ICD-10-CM | POA: Diagnosis not present

## 2014-09-20 DIAGNOSIS — E118 Type 2 diabetes mellitus with unspecified complications: Secondary | ICD-10-CM | POA: Diagnosis not present

## 2014-10-08 ENCOUNTER — Encounter (HOSPITAL_COMMUNITY): Payer: Self-pay | Admitting: *Deleted

## 2014-10-08 ENCOUNTER — Emergency Department (HOSPITAL_COMMUNITY)
Admission: EM | Admit: 2014-10-08 | Discharge: 2014-11-06 | Disposition: E | Payer: Medicare Other | Attending: Emergency Medicine | Admitting: Emergency Medicine

## 2014-10-08 DIAGNOSIS — Z8669 Personal history of other diseases of the nervous system and sense organs: Secondary | ICD-10-CM | POA: Diagnosis not present

## 2014-10-08 DIAGNOSIS — Z7982 Long term (current) use of aspirin: Secondary | ICD-10-CM | POA: Diagnosis not present

## 2014-10-08 DIAGNOSIS — I469 Cardiac arrest, cause unspecified: Secondary | ICD-10-CM | POA: Diagnosis not present

## 2014-10-08 DIAGNOSIS — E119 Type 2 diabetes mellitus without complications: Secondary | ICD-10-CM | POA: Diagnosis not present

## 2014-10-08 DIAGNOSIS — Z951 Presence of aortocoronary bypass graft: Secondary | ICD-10-CM | POA: Insufficient documentation

## 2014-10-08 DIAGNOSIS — J449 Chronic obstructive pulmonary disease, unspecified: Secondary | ICD-10-CM | POA: Diagnosis not present

## 2014-10-08 DIAGNOSIS — I10 Essential (primary) hypertension: Secondary | ICD-10-CM | POA: Diagnosis not present

## 2014-10-08 DIAGNOSIS — I2510 Atherosclerotic heart disease of native coronary artery without angina pectoris: Secondary | ICD-10-CM | POA: Insufficient documentation

## 2014-10-08 DIAGNOSIS — Z79899 Other long term (current) drug therapy: Secondary | ICD-10-CM | POA: Diagnosis not present

## 2014-10-08 DIAGNOSIS — E782 Mixed hyperlipidemia: Secondary | ICD-10-CM | POA: Diagnosis not present

## 2014-10-08 DIAGNOSIS — Z8611 Personal history of tuberculosis: Secondary | ICD-10-CM | POA: Diagnosis not present

## 2014-10-08 DIAGNOSIS — Z87891 Personal history of nicotine dependence: Secondary | ICD-10-CM | POA: Diagnosis not present

## 2014-10-08 MED ORDER — EPINEPHRINE HCL 0.1 MG/ML IJ SOSY
PREFILLED_SYRINGE | INTRAMUSCULAR | Status: AC | PRN
  Administered 2014-10-08 (×2): 1 via INTRAVENOUS

## 2014-10-09 MED FILL — Medication: Qty: 1 | Status: AC

## 2014-10-12 DIAGNOSIS — J449 Chronic obstructive pulmonary disease, unspecified: Secondary | ICD-10-CM | POA: Diagnosis not present

## 2014-10-12 DIAGNOSIS — R0600 Dyspnea, unspecified: Secondary | ICD-10-CM | POA: Diagnosis not present

## 2014-11-06 DIAGNOSIS — 419620001 Death: Secondary | SNOMED CT | POA: Insufficient documentation

## 2014-11-06 NOTE — Progress Notes (Signed)
RT responded to Trauma C for post CPR pt. Upon arrival to ED, pt pulseless and CPR initiated. Pt on EMS ventilator and was taken off and manually ventilated throughout code. Pt had king airway in place prior to arrival to ED. No difficulty with ventilating pt. End tidal CO2 placed on king airway with reading of 11. MD aware. Pt expired at 1605. King airway left in place pending ME case approval. RT will continue to monitor.

## 2014-11-06 NOTE — Code Documentation (Signed)
No pulse.  cpr initiated.

## 2014-11-06 NOTE — Code Documentation (Signed)
Pulse check.  No pulse. Continue cpr.

## 2014-11-06 NOTE — Progress Notes (Signed)
Chaplain was already in dept and was asked to support family of post-CPR pt who had just expired in Trauma C. Chaplain accompanied Dr. Joya Gaskins (ED resident) to consult B where she informed pt's son that pt had passed. Chaplain provided emotional support to pt as he processed this sad news. In due time pt's daughter arrived and later pt's wife and other relatives. I brought Dr. Joya Gaskins back again (at her request) to brief additional family members. I brought family to Trauma C to be with pt and stayed in the room with them since initially this was an ME case. After ME ruled it was no longer an ME case, I escorted family out of room so a tech could remove the tube. I secured contact info and gave to RN and gave pt's son the phone number for Patient Placement so he could call with funeral home information. I had to leave the department briefly. I returned with the overnight chaplain and introduced him to the family.

## 2014-11-06 NOTE — Code Documentation (Signed)
Family updated as to patient's status an at bedside.

## 2014-11-06 NOTE — Code Documentation (Signed)
Pulse check. No pulse.  No cardiac movement with ECHO. Time of death, 28.

## 2014-11-06 NOTE — Code Documentation (Addendum)
Wife stated that pt c/o sob while coming home from church today.  Stated he was on the toilet and became very sob.  She assisted him to the bed and he became unresponsive and she called 911 at 1636.  Fire arrived at [employer] and began CPR.  When EMS arrived at 1650 pt was asytole.  They gave 9 epis.  Cold saline started.CBG 359.  1712 pt was PEA,  But regained pulses at 1716.  They began pacing pt at 1716.  Pt has no pulse upon arrival, though pt is being paced.

## 2014-11-06 NOTE — Code Documentation (Signed)
Patient time of death occurred at 33.

## 2014-11-06 NOTE — ED Provider Notes (Signed)
CSN: [id number]     Arrival date & time October 20, 2014  1759 History   First MD Initiated Contact with Patient October 20, 2014 1810     No chief complaint on file.    (Consider location/radiation/quality/duration/timing/severity/associated sxs/prior Treatment) Patient is a 80 y.o. male presenting with syncope. The history is provided by the EMS personnel. The history is limited by the condition of the patient.  Loss of Consciousness Episode history:  Single Most recent episode:  Today Timing:  Constant Progression:  Worsening Chronicity:  New Context: bowel movement   Witnessed: no   Relieved by: initially relieved by ACLS. Worsened by:  Nothing tried Ineffective treatments: ACLS. Associated symptoms: difficulty breathing   Risk factors: coronary artery disease     Past Medical History  Diagnosis Date  . Cardiomyopathy secondary     LVEF 60-65% 9/12, LVEF 43% by Myoview 11/12  . Coronary atherosclerosis of native coronary artery     s/p MI in 2005, s/p DES to RCA in 2005;  s/p CABG in 3/09: L-LAD, S-Dx, S-OM, S-dRCA/PDA  . Type 2 diabetes mellitus   . Atrial flutter     s/p RFCA 2005  . Essential hypertension, benign   . Cerebral aneurysm     s/p clipping   . Mixed hyperlipidemia   . Carotid stenosis     Occluded RICA  . COPD (chronic obstructive pulmonary disease)   . Lung nodule   . History of tuberculosis     1971  . OSA (obstructive sleep apnea)    Past Surgical History  Procedure Laterality Date  . Coronary artery bypass graft  2005    LIMA to LAD, SVG to diagonal, SVG to OM, SVG to RCA and PDA   Family History  Problem Relation Age of Onset  . Diabetes Mother   . Hypertension    . Arthritis Mother   . Tuberculosis Brother   . Tuberculosis Sister    History  Substance Use Topics  . Smoking status: Former Smoker -- 1.00 packs/day for 35 years    Types: Cigarettes    Start date: 06/04/1953    Quit date: 04/07/1986  . Smokeless tobacco: Never Used  . Alcohol Use:  No    Review of Systems  Unable to perform ROS: Acuity of condition  Cardiovascular: Positive for syncope.      Allergies  Review of patient's allergies indicates no known allergies.  Home Medications   Prior to Admission medications   Medication Sig Start Date End Date Taking? Authorizing Provider  acetaminophen (TYLENOL) 500 MG tablet Take 500 mg by mouth every 4 (four) hours as needed for moderate pain.     Historical Provider, MD  albuterol (PROVENTIL HFA;VENTOLIN HFA) 108 (90 BASE) MCG/ACT inhaler Inhale 3 puffs into the lungs every 6 (six) hours as needed for wheezing or shortness of breath.    Historical Provider, MD  aspirin 81 MG tablet Take 81 mg by mouth daily.    Historical Provider, MD  carvedilol (COREG) 12.5 MG tablet Take 1 1/2 tablets twice a day 06/05/14   Satira Sark, MD  furosemide (LASIX) 40 MG tablet Take 1 tablet (40 mg total) by mouth 2 (two) times daily. 07/13/14   Satira Sark, MD  glipiZIDE (GLUCOTROL) 10 MG tablet Take 10 mg by mouth 2 (two) times daily before a meal.      Historical Provider, MD  isosorbide mononitrate (IMDUR) 30 MG 24 hr tablet Take 30 mg by mouth 2 (two) times daily.  Historical Provider, MD  lisinopril (PRINIVIL) 2.5 MG tablet Take 1 tablet (2.5 mg total) by mouth daily. 12/14/13   Thurnell Lose, MD  metFORMIN (GLUCOPHAGE) 1000 MG tablet Take 1,000 mg by mouth 2 (two) times daily with a meal.      Historical Provider, MD  Polyethyl Glycol-Propyl Glycol (SYSTANE OP) Place 1 drop into both eyes daily as needed (for burning eyes).    Historical Provider, MD  potassium chloride (K-DUR) 10 MEQ tablet Take 1 tablet (10 mEq total) by mouth daily. 12/14/13   Thurnell Lose, MD  ranitidine (ZANTAC) 150 MG capsule Take 150 mg by mouth at bedtime as needed for heartburn.     Historical Provider, MD  simvastatin (ZOCOR) 20 MG tablet Take 1 tablet (20 mg total) by mouth at bedtime. 06/07/14   Satira Sark, MD  tiotropium (SPIRIVA  HANDIHALER) 18 MCG inhalation capsule Place 1 capsule (18 mcg total) into inhaler and inhale daily. 09/14/14   Chesley Mires, MD   ED Triage Vitals  Enc Vitals Group     BP --      Pulse --      Resp --      Temp --      Temp src --      SpO2 --      Weight 10-15-14 1840 186 lb (84.369 kg)     Height --      Head Cir --      Peak Flow --      Pain Score --      Pain Loc --      Pain Edu? --      Excl. in [place]? --     Physical Exam  Constitutional: He appears well-developed and well-nourished. He appears toxic. He is intubated. Backboard in place.  HENT:  Head: Normocephalic and atraumatic.  Nose: Nose normal.  Mouth/Throat: Oropharynx is clear and moist. No oropharyngeal exudate.  King airway in place  Eyes:  Fixed and dilated bilaterally  Cardiovascular:  No pulses palpable  Pulmonary/Chest: He is intubated. He is in respiratory distress.  Mechanically ventilated No spontaneous breaths  Abdominal: Soft. He exhibits no distension.  Musculoskeletal:  IO at left tibia No significant MSK deformities noted  Neurological: He is unresponsive.  Skin: Skin is warm and dry. He is not diaphoretic. No pallor.  Nursing note and vitals reviewed.   ED Course  Procedures (including critical care time)    EMERGENCY DEPARTMENT [place] CARDIAC EXAM "Study: Limited Ultrasound of the heart and pericardium"  INDICATIONS:Cardiac arrest Multiple views of the heart and pericardium are obtained with a multi-frequency probe.  PERFORMED XT:[id number]  IMAGES ARCHIVED?: Yes  FINDINGS: No pericardial effusion and No cardiac activity  LIMITATIONS:  Emergent procedure  VIEWS USED: Subcostal 4 chamber  INTERPRETATION: Cardiac activity absent, Pericardial effusioin absent and no cardiac contractility  COMMENT:  POCUS Echo during CPR which showed no spontaneous cardiac function.     Labs Review Labs Reviewed - No data to display  Imaging Review No results found.   EKG Interpretation None       MDM   Final diagnoses:  Cardiac arrest  Death   Pt is a 80 yo M with hx of COPD, A fib, HTN, cerebral aneurysm, CAD, cardiomyopathy, DM, and OSA who presented after CPR.  Reportedly was SOB after church today.  Was lightheaded when going to the bathroom this afternoon then passed out when sitting on the toilet. Wife called EMS around 1640 Per  EMS, he was pulseless on arrival at 1645, asystole on monitor.  Underwent CPR and 9 rounds of epi until [place] around 1515.  PIV at left arm and IO in left tibia.  Intubated with king airway.  After ROSC, pt was bradycardic, so externally paced en route to [facility]. Arrived at [facility] ED at approximately 1755.  Pulseless on arrival, so started CPR immediately.  End tidal CO2 11.  Underwent ACLS with CPR and 2 rounds of epinephrine.  Pulseless and no cardiac rhythm on echo.  The code was called and patient was pronounced dead at [facility] on 11-03-14.  ED team at bedside and in agreement.   Chaplin notified and family called to the ED.   Family (wife, 2 sons, and daughter) informed about patient's status in the ED with assistance from the ED Kela Millin.  All questions were answered.    1930: Called the medical examiner Dr. Dara Lords who agrees that he most likely had a natural death, so would not be an ME case.     Patient was seen with ED Attending, Dr. Lucina Mellow, MD   Tori Milks, MD 10/09/14 3354  Leonard Schwartz, MD 10/22/14 [phone]

## 2014-11-06 DEATH — deceased

## 2014-12-12 ENCOUNTER — Ambulatory Visit: Payer: Medicare Other | Admitting: Cardiology

## 2015-04-17 IMAGING — NM NM MYOCAR MULTI W/SPECT W/WALL MOTION & EF
2 series · 12 of 12 positions shown · non-contrast
Comparison: None.

CLINICAL DATA: 79-year-old male with a history of coronary artery
disease and coronary artery bypass graft surgery with reduction in
ejection fraction to 40-45% which had been previously documented at
55%. He is now referred for an ischemic evaluation.

EXAM:
MYOCARDIAL IMAGING WITH SPECT (REST AND PHARMACOLOGIC-STRESS)
GATED LEFT VENTRICULAR WALL MOTION STUDY
LEFT VENTRICULAR EJECTION FRACTION
TECHNIQUE: Standard myocardial SPECT imaging was performed after resting
intravenous injection of 10 mCi 7c-EEm sestamibi. Subsequently,
intravenous infusion of Lexiscan was performed under the supervision
of the Cardiology staff. At peak effect of the drug, 30 mCi 7c-EEm
sestamibi was injected intravenously and standard myocardial SPECT
imaging was performed. Quantitative gated imaging was also performed
to evaluate left ventricular wall motion, and estimate left
ventricular ejection fraction.

[Series 1: rest · 8.28mm/px · 6 of 64 frames shown]
[frame 6/64]
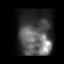
[frame 16/64]
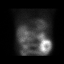
[frame 27/64]
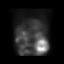
[frame 38/64]
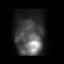
[frame 48/64]
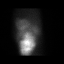
[frame 59/64]
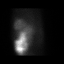

[Series 2: stress gated · 8.28mm/px · 6 of 64 frames shown]
[frame 6/64]
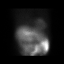
[frame 16/64]
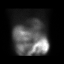
[frame 27/64]
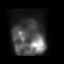
[frame 38/64]
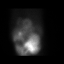
[frame 48/64]
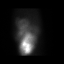
[frame 59/64]
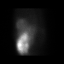

[12 of 12 positions shown; findings below may reference images not displayed]

FINDINGS: Stress/ECG data: The patient was stressed according to the Lexiscan
protocol. The heart rate ranged from 71 up to 88 beats per min. The
blood pressure ranges from 149/96 up to 165/60. No chest pain was
reported.

Baseline tracing demonstrated sinus rhythm with an underlying right
bundle-branch block and PACs in a pattern of atrial bigeminy. There
were T-wave inversions in lead V3. There were no significant changes
seen with stress. Isolated PVCs were noted in recovery.

Perfusion: Mildly decreased uptake in the inferoseptal wall on
stress and rest images and anteroapical and anteroseptal walls,
indicative of myocardial scar. Apical thinning was noted.

Wall Motion: Mild hypokinesis of the aforementioned walls. No left
ventricular dilation.

Left Ventricular Ejection Fraction: 47 %

End diastolic volume 106 ml

End systolic volume 56 ml
IMPRESSION: 1. Myocardial scar in inferoseptal, anteroapical and anteroseptal
walls. No ischemic zones noted..

2. Mild hypokinesis of the aforementioned walls.

3. Left ventricular ejection fraction 47%

4. Intermediate-risk stress test findings*.

*6096 Appropriate Use Criteria for Coronary Revascularization
Focused Update: J Am Coll Cardiol. 6096;59(9):857-881.
[URL]
